# Patient Record
Sex: Female | Born: 1940 | Race: Black or African American | Hispanic: No | Smoking: Never smoker
Health system: Southern US, Community
[De-identification: ages and names within clinical notes are randomized; demographics above are authoritative.]

## PROBLEM LIST (undated history)

## (undated) HISTORY — PX: ABDOMINAL HYSTERECTOMY: SHX81

---

## 2010-01-19 ENCOUNTER — Emergency Department (HOSPITAL_COMMUNITY): Admission: EM | Admit: 2010-01-19 | Discharge: 2010-01-19 | Payer: Self-pay | Admitting: Emergency Medicine

## 2010-11-09 LAB — HEPATIC FUNCTION PANEL
ALT: 37 U/L — ABNORMAL HIGH (ref 0–35)
Bilirubin, Direct: 0.3 mg/dL (ref 0.0–0.3)
Indirect Bilirubin: 1.1 mg/dL — ABNORMAL HIGH (ref 0.3–0.9)

## 2010-11-09 LAB — BASIC METABOLIC PANEL
BUN: 18 mg/dL (ref 6–23)
CO2: 28 mEq/L (ref 19–32)
GFR calc non Af Amer: 46 mL/min — ABNORMAL LOW (ref >60)
Potassium: 3.6 mEq/L (ref 3.5–5.1)
Sodium: 134 mEq/L — ABNORMAL LOW (ref 135–145)

## 2010-11-09 LAB — DIFFERENTIAL
Lymphs Abs: 0.7 10*3/uL (ref 0.7–4.0)
Monocytes Relative: 9 % (ref 3–12)
Neutrophils Relative %: 61 % (ref 43–77)

## 2010-11-09 LAB — URINALYSIS, ROUTINE W REFLEX MICROSCOPIC
Glucose, UA: NEGATIVE mg/dL
Hgb urine dipstick: NEGATIVE
Ketones, ur: 15 mg/dL — AB
Specific Gravity, Urine: 1.025 (ref 1.005–1.030)

## 2010-11-09 LAB — URINE CULTURE: Colony Count: NO GROWTH

## 2010-11-09 LAB — URINE MICROSCOPIC-ADD ON

## 2010-11-09 LAB — CBC
MCV: 90.8 fL (ref 78.0–100.0)
Platelets: 118 10*3/uL — ABNORMAL LOW (ref 150–400)
RDW: 12 % (ref 11.5–15.5)

## 2010-11-09 LAB — POCT CARDIAC MARKERS
CKMB, poc: <1 ng/mL — ABNORMAL LOW (ref 1.0–8.0)
Troponin i, poc: <0.05 ng/mL (ref 0.00–0.09)

## 2011-09-11 IMAGING — CR DG CHEST 2V
2 series · 2 of 2 positions shown · non-contrast
Comparison: None

CLINICAL DATA: Dizziness, weakness, fever.

CHEST - 2 VIEW

[w chest pa]
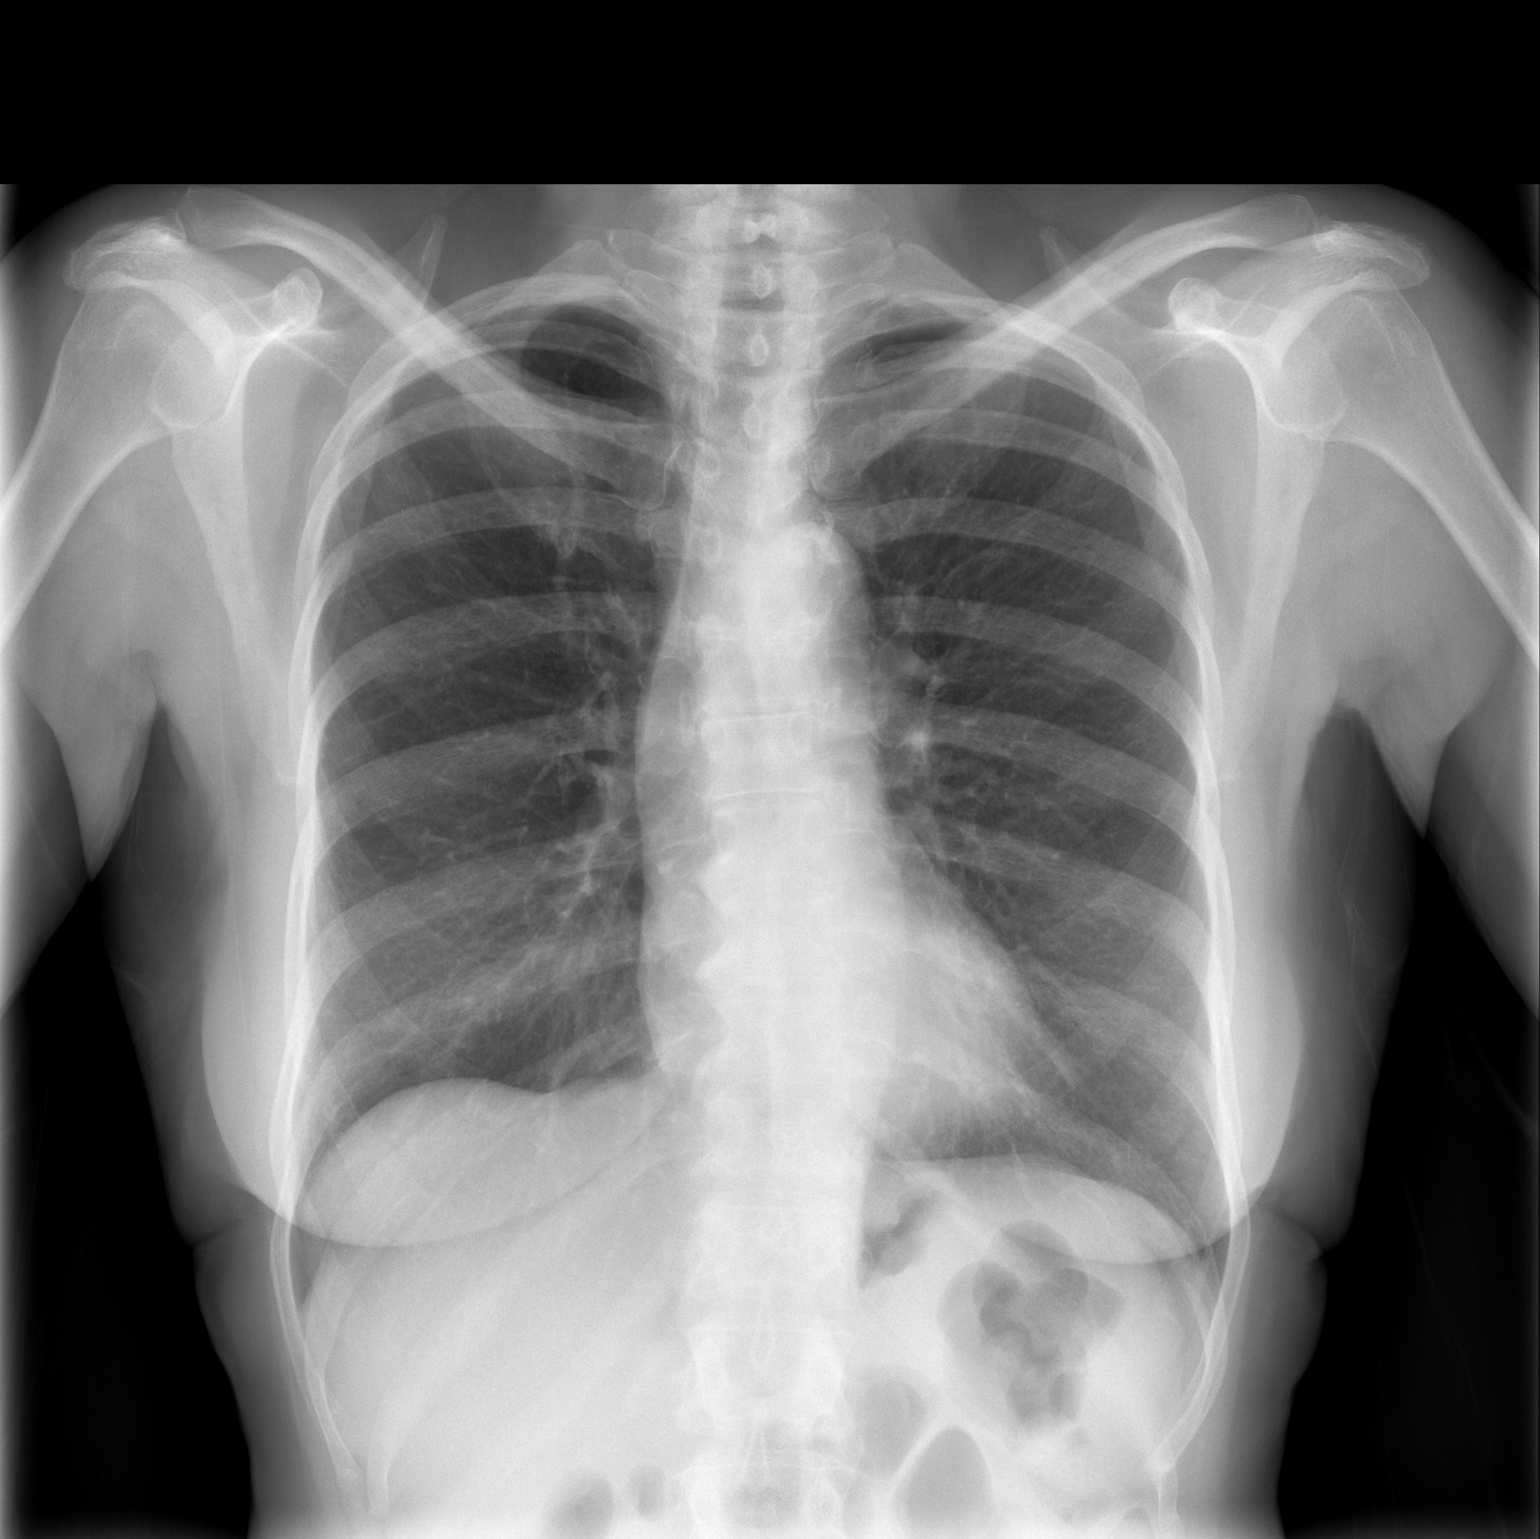

[w chest lat]
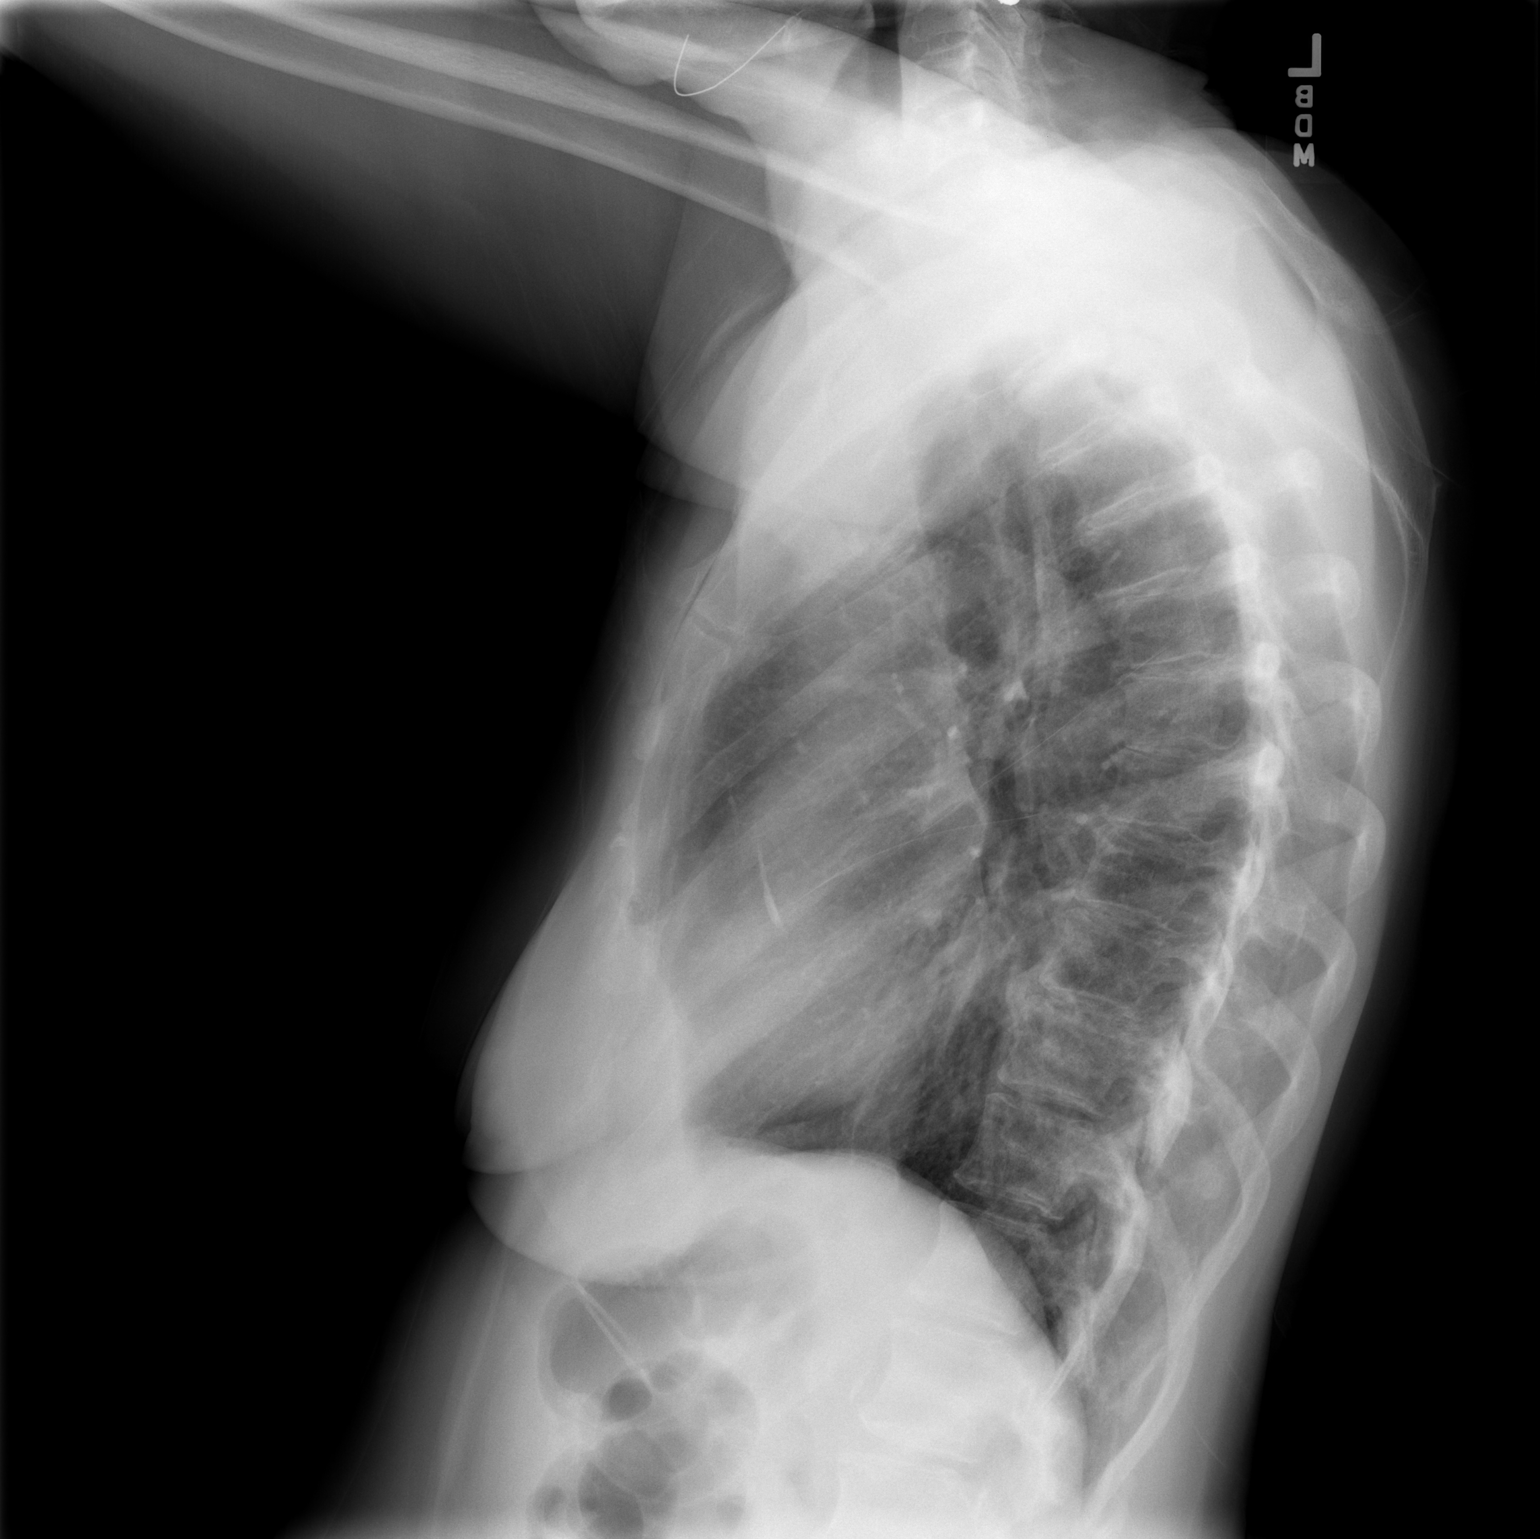

[2 of 2 positions shown; findings below may reference images not displayed]

FINDINGS: Heart and mediastinal contours are within normal limits.
No focal opacities or effusions.  No acute bony abnormality.
IMPRESSION: No acute cardiopulmonary disease.

## 2017-11-15 ENCOUNTER — Encounter (INDEPENDENT_AMBULATORY_CARE_PROVIDER_SITE_OTHER): Payer: Medicare Other | Admitting: Ophthalmology

## 2017-11-15 DIAGNOSIS — H43813 Vitreous degeneration, bilateral: Secondary | ICD-10-CM

## 2017-11-15 DIAGNOSIS — H2513 Age-related nuclear cataract, bilateral: Secondary | ICD-10-CM | POA: Diagnosis not present

## 2017-11-15 DIAGNOSIS — H3509 Other intraretinal microvascular abnormalities: Secondary | ICD-10-CM

## 2017-11-15 DIAGNOSIS — H353111 Nonexudative age-related macular degeneration, right eye, early dry stage: Secondary | ICD-10-CM | POA: Diagnosis not present

## 2017-11-16 ENCOUNTER — Encounter (INDEPENDENT_AMBULATORY_CARE_PROVIDER_SITE_OTHER): Payer: Self-pay | Admitting: Ophthalmology

## 2018-01-17 ENCOUNTER — Encounter (INDEPENDENT_AMBULATORY_CARE_PROVIDER_SITE_OTHER): Payer: Medicare Other | Admitting: Ophthalmology

## 2018-01-27 ENCOUNTER — Encounter (INDEPENDENT_AMBULATORY_CARE_PROVIDER_SITE_OTHER): Payer: Medicare Other | Admitting: Ophthalmology

## 2018-01-27 DIAGNOSIS — I10 Essential (primary) hypertension: Secondary | ICD-10-CM

## 2018-01-27 DIAGNOSIS — H35033 Hypertensive retinopathy, bilateral: Secondary | ICD-10-CM

## 2018-01-27 DIAGNOSIS — H3509 Other intraretinal microvascular abnormalities: Secondary | ICD-10-CM

## 2018-08-03 ENCOUNTER — Encounter (INDEPENDENT_AMBULATORY_CARE_PROVIDER_SITE_OTHER): Payer: Medicare Other | Admitting: Ophthalmology

## 2018-08-30 ENCOUNTER — Encounter (INDEPENDENT_AMBULATORY_CARE_PROVIDER_SITE_OTHER): Payer: Medicare Other | Admitting: Ophthalmology

## 2019-01-12 ENCOUNTER — Encounter: Payer: Self-pay | Admitting: Cardiology

## 2019-01-12 ENCOUNTER — Other Ambulatory Visit: Payer: Self-pay

## 2019-01-12 ENCOUNTER — Telehealth: Payer: Self-pay | Admitting: Cardiovascular Disease

## 2019-01-12 ENCOUNTER — Ambulatory Visit: Payer: Medicare Other | Admitting: Cardiology

## 2019-01-12 ENCOUNTER — Ambulatory Visit (HOSPITAL_COMMUNITY)
Admission: RE | Admit: 2019-01-12 | Discharge: 2019-01-12 | Disposition: A | Discharge status: Home / Self Care | Payer: Medicare Other | Source: Ambulatory Visit | Attending: Cardiology | Admitting: Cardiology

## 2019-01-12 ENCOUNTER — Other Ambulatory Visit (HOSPITAL_COMMUNITY): Payer: Self-pay | Admitting: Cardiology

## 2019-01-12 VITALS — BP 153/80 | HR 81 | Ht 64.0 in | Wt 127.2 lb

## 2019-01-12 DIAGNOSIS — Z79899 Other long term (current) drug therapy: Secondary | ICD-10-CM

## 2019-01-12 DIAGNOSIS — I739 Peripheral vascular disease, unspecified: Secondary | ICD-10-CM | POA: Insufficient documentation

## 2019-01-12 DIAGNOSIS — M79672 Pain in left foot: Secondary | ICD-10-CM | POA: Insufficient documentation

## 2019-01-12 MED ORDER — CEPHALEXIN 500 MG PO CAPS: 500.0000 mg | ORAL_CAPSULE | Freq: Three times a day (TID) | ORAL | 0 refills | Status: DC

## 2019-01-12 NOTE — Progress Notes (Signed)
Cardiology Office Note   Date:  01/12/2019   ID:  Gina MaterMargie Klayman, DOB 10-17-1940, MRN [id number]  PCP:  Patient, No Pcp Per  Cardiologist:   Rollene RotundaJames Favor Kreh, MD Referring:  Dr. Laretta BolsterAllouny  No chief complaint on file.     History of Present Illness: Gina MaterMargie Osberg is a 78 y.o. female who presents for evaluation of left foot pain.  The patient has never been seen before.  It does not sound like she has much in the way of medical care at all.  She says she does not know to go to the doctors.  She does not report any significant past medical history.  She has been seeing podiatry and apparently had the toenail trimmed she said several days ago.  She went to see the podiatrist again today.  She reports that she has had discomfort in her foot for only a few days but the podiatry notes and just that she has been self treating her left foot for several days.  She has been treated with antibiotics.  Because the foot seem to have more uncomfortable quickly she was sent for this appointment in vascular studies.  The patient is really vague about her symptoms.  She says it really has not been hurting but when I go to examine it she is flinching.  She is denying any fevers or chills.  She is never had any cardiovascular complaint such as palpitations, presyncope or syncope.  She is not having any chest pressure, neck or arm discomfort.  She has no shortness of breath, PND or orthopnea.  He says that she really did not notice her foot was red until this morning but it sounds like from the 1 office note I have that this probably has been going on for several days.  Of note I did review the notes and the patient had antibiotics prescribed amoxicillin clavulanic acid 1 tablet 3 times daily today but she was not aware that she actually had this prescribed.  PMH:  None  PSH:  Abdominal hystrectomy  Past Surgical History:  Procedure Laterality Date  . ABDOMINAL HYSTERECTOMY       Current Outpatient  Medications  Medication Sig Dispense Refill  . aspirin EC 81 MG tablet Take 1 tablet by mouth daily.    . cephALEXin (KEFLEX) 500 MG capsule Take 1 capsule (500 mg total) by mouth 3 (three) times daily. For 7 days 21 capsule 0   No current facility-administered medications for this visit.     Allergies:   Patient has no known allergies.    Social History:  The patient  reports that she has never smoked. She has never used smokeless tobacco. She reports that she does not drink alcohol.   Family History:  The patient's none.  The patient does not know her family history   ROS:  Please see the history of present illness.   Otherwise, review of systems are positive for none.   All other systems are reviewed and negative.    PHYSICAL EXAM: VS:  BP (!) 153/80   Pulse 81   Ht 5\' 4"  (1.626 m)   Wt 127 lb 3.2 oz (57.7 kg)   BMI 21.83 kg/m  , BMI Body mass index is 21.83 kg/m. GENERAL:  Well appearing HEENT:  Pupils equal round and reactive, fundi not visualized, oral mucosa unremarkable NECK:  No jugular venous distention, waveform within normal limits, carotid upstroke brisk and symmetric, no bruits, no thyromegaly LYMPHATICS:  No cervical,  inguinal adenopathy LUNGS:  Clear to auscultation bilaterally BACK:  No CVA tenderness CHEST:  Unremarkable HEART:  PMI not displaced or sustained,S1 and S2 within normal limits, no S3, no S4, no clicks, no rubs, no murmurs ABD:  Flat, positive bowel sounds normal in frequency in pitch, no bruits, no rebound, no guarding, no midline pulsatile mass, no hepatomegaly, no splenomegaly EXT:  Erythema of the left foot, absent popliteals bilaterally, absent dorsalis pedis and posterior tibialis, SKIN:  No rashes no nodules NEURO:  Cranial nerves II through XII grossly intact, motor grossly intact throughout PSYCH:  Cognitively intact, oriented to person place and time    EKG:  EKG is ordered today. The ekg ordered today demonstrates sinus rhythm, rate  69, axis within normal limits, intervals within limits, no acute changes.   Recent Labs: No results found for requested labs within last 8760 hours.    Lipid Panel No results found for: CHOL, TRIG, HDL, CHOLHDL, VLDL, LDLCALC, LDLDIRECT    Wt Readings from Last 3 Encounters:  01/12/19 127 lb 3.2 oz (57.7 kg)      Other studies Reviewed: Additional studies/ records that were reviewed today include: Podiatry records. Review of the above records demonstrates:  Please see elsewhere in the note.     ASSESSMENT AND PLAN:  PVD: The patient was sent today for arterial Dopplers and is found to have preliminarily severe disease.  ABI on the left is 0.38.  On the right is 0.85.  I do note that she was given a prescription for amoxicillin clavulanic acid but she was not aware of this.  We are making sure that this is in the pharmacy for her to pick up.  I have also reviewed the case with Dr. Allyson Sabal.  The patient needs to come back on Tuesday for CBC, basic metabolic profile.  She will see Dr. Allyson Sabal at that time.  She is instructed that should she get increasing pain or erythema or swelling over the weekend she should go to the emergency room.  There was no evidence of thrombus.    HTN: Her blood pressure is elevated.  This can be managed after gets repeated readings.  Unfortunately the patient seems very disconnected from any understanding of her clinical situation and has not had any medical care for the most part to speak of.  She will need aggressive risk reduction and management going forward.   Current medicines are reviewed at length with the patient today.  The patient does not have concerns regarding medicines.  The following changes have been made:  As above  Labs/ tests ordered today include:  No orders of the defined types were placed in this encounter.    Disposition:   FU with blood work on Tuesday and Dr. Allyson Sabal will see her briefly to discuss angiography.     Signed, Rollene Rotunda, MD  01/12/2019 5:11 PM    [facility]

## 2019-01-12 NOTE — Telephone Encounter (Signed)
Pt have office visit with Dr Antoine Poche today

## 2019-01-12 NOTE — Patient Instructions (Addendum)
Medication Instructions:  Continue current medications  If you need a refill on your cardiac medications before your next appointment, please call your pharmacy.  Labwork: CBC and BMP  Testing/Procedures: None Ordered  Follow-Up: Your physician recommends that you schedule a follow-up appointment in: with Dr Allyson Sabal Tuesday  At [facility], you and your health needs are our priority.  As part of our continuing mission to provide you with exceptional heart care, we have created designated Provider Care Teams.  These Care Teams include your primary Cardiologist (physician) and Advanced Practice Providers (APPs -  Physician Assistants and Nurse Practitioners) who all work together to provide you with the care you need, when you need it.  Thank you for choosing [facility] at [facility]!!

## 2019-01-16 ENCOUNTER — Other Ambulatory Visit: Payer: Self-pay

## 2019-01-16 ENCOUNTER — Telehealth: Payer: Self-pay | Admitting: *Deleted

## 2019-01-16 ENCOUNTER — Ambulatory Visit: Payer: Medicare Other | Admitting: Cardiovascular Disease

## 2019-01-16 ENCOUNTER — Encounter: Payer: Self-pay | Admitting: Cardiovascular Disease

## 2019-01-16 ENCOUNTER — Telehealth: Payer: Self-pay

## 2019-01-16 DIAGNOSIS — I70229 Atherosclerosis of native arteries of extremities with rest pain, unspecified extremity: Secondary | ICD-10-CM | POA: Insufficient documentation

## 2019-01-16 DIAGNOSIS — Z959 Presence of cardiac and vascular implant and graft, unspecified: Secondary | ICD-10-CM | POA: Diagnosis not present

## 2019-01-16 DIAGNOSIS — I998 Other disorder of circulatory system: Secondary | ICD-10-CM | POA: Diagnosis not present

## 2019-01-16 NOTE — Assessment & Plan Note (Signed)
Gina Carter was referred to me by Dr. Antoine Poche who saw this patient last Friday for critical limb ischemia.  She is 78 years old without significant risk factors.  She developed left foot pain approximately 3 to 6 weeks ago.  He saw her podiatrist who referred her here.  She had Doppler studies performed 01/14/2019 revealing high-grade mid left SFA with occluded tibials on that side.  Her ABI on the right was 25 and on the left was 0.38.  She will need angiography and potential endovascular therapy for rest pain.  I did speak to her daughter [name] and explained the situation.

## 2019-01-16 NOTE — Progress Notes (Signed)
01/16/2019 [name]   Dec 06, 1940  [id number]  Primary Physician Patient, No Pcp Per Primary Cardiologist: Runell Gess MD Milagros Loll, [place], [place]  HPI:  Norita Davault is a 78 y.o. thin appearing widowed African-American female mother of 3, grandmother of 6 grandchildren referred to me by Dr. Antoine Poche for peripheral vascular valuation because of critical limb ischemia and rest pain.  She has no cardiac risk factors.  She was referred by Dr. Apolonio Schneiders to Dr. Antoine Poche because of this.  She has had dependent rubor and pain in her left foot for several weeks.  There are no open wounds.  She cannot walk with this.  Dopplers performed 01/14/2019 revealed high-grade mid left SFA stenosis with 0 vessel runoff.  Her right ABI was 0.85 and left was 0.38.   Current Meds  Medication Sig  . aspirin EC 81 MG tablet Take 1 tablet by mouth daily.     No Known Allergies  Social History   Socioeconomic History  . Marital status: Widowed    Spouse name: Not on file  . Number of children: Not on file  . Years of education: Not on file  . Highest education level: Not on file  Occupational History  . Not on file  Social Needs  . Financial resource strain: Not on file  . Food insecurity:    Worry: Not on file    Inability: Not on file  . Transportation needs:    Medical: Not on file    Non-medical: Not on file  Tobacco Use  . Smoking status: Never Smoker  . Smokeless tobacco: Never Used  Substance and Sexual Activity  . Alcohol use: Never    Frequency: Never  . Drug use: Not on file  . Sexual activity: Not on file  Lifestyle  . Physical activity:    Days per week: Not on file    Minutes per session: Not on file  . Stress: Not on file  Relationships  . Social connections:    Talks on phone: Not on file    Gets together: Not on file    Attends religious service: Not on file    Active member of club or organization: Not on file    Attends meetings of clubs or organizations:  Not on file    Relationship status: Not on file  . Intimate partner violence:    Fear of current or ex partner: Not on file    Emotionally abused: Not on file    Physically abused: Not on file    Forced sexual activity: Not on file  Other Topics Concern  . Not on file  Social History Narrative   Lives alone.  Three children.       Review of Systems: General: negative for chills, fever, night sweats or weight changes.  Cardiovascular: negative for chest pain, dyspnea on exertion, edema, orthopnea, palpitations, paroxysmal nocturnal dyspnea or shortness of breath Dermatological: negative for rash Respiratory: negative for cough or wheezing Urologic: negative for hematuria Abdominal: negative for nausea, vomiting, diarrhea, bright red blood per rectum, melena, or hematemesis Neurologic: negative for visual changes, syncope, or dizziness All other systems reviewed and are otherwise negative except as noted above.    Blood pressure 118/78, pulse 79, temperature 98.4 F (36.9 C), height 5\' 7"  (1.702 m), weight 127 lb (57.6 kg).  General appearance: alert and no distress Neck: no adenopathy, no carotid bruit, no JVD, supple, symmetrical, trachea midline and thyroid not enlarged, symmetric, no  tenderness/mass/nodules Lungs: clear to auscultation bilaterally Heart: regular rate and rhythm, S1, S2 normal, no murmur, click, rub or gallop Extremities: extremities normal, atraumatic, no cyanosis or edema Pulses: Absent left pedal pulses Skin: Dependent rubor on the left Neurologic: Alert and oriented X 3, normal strength and tone. Normal symmetric reflexes. Normal coordination and gait  EKG normal sinus rhythm at 79 with nonspecific ST and T wave changes.  I personally reviewed this EKG.  ASSESSMENT AND PLAN:   Critical limb ischemia with history of revascularization of same extremity Ms. Raul Dellston was referred to me by Dr. Antoine PocheHochrein who saw this patient last Friday for critical limb  ischemia.  She is 78 years old without significant risk factors.  She developed left foot pain approximately 3 to 6 weeks ago.  He saw her podiatrist who referred her here.  She had Doppler studies performed 01/14/2019 revealing high-grade mid left SFA with occluded tibials on that side.  Her ABI on the right was 25 and on the left was 0.38.  She will need angiography and potential endovascular therapy for rest pain.  I did speak to her daughter Clydie BraunKaren and explained the situation.      Runell GessJonathan J.  MD FACP,FACC,FAHA, FSCAI 01/16/2019 2:10 PM

## 2019-01-16 NOTE — Telephone Encounter (Signed)

## 2019-01-16 NOTE — Telephone Encounter (Signed)
Received verbal consent from pt. Spoke with patient daughter [name] regarding setting up pt for PV procedure on 5/28.  She was made aware that pt to arrive at [facility] at 0900 for COVID testing on day of procedure and then report to [place] 2 hours prior to 1200 procedure. Patient instructions reviewed with pt and pt cousin in detail during OV on 5/26. Will contact pt daughter to review info on 5/27

## 2019-01-16 NOTE — H&P (View-Only) (Signed)
01/16/2019 [name]   Dec 06, 1940  [id number]  Primary Physician Patient, No Pcp Per Primary Cardiologist: Runell Gess MD Gina Carter, Gina Carter, Gina Carter  HPI:  Gina Carter is a 78 y.o. thin appearing widowed African-American female mother of 3, grandmother of 6 grandchildren referred to me by Dr. Antoine Poche for peripheral vascular valuation because of critical limb ischemia and rest pain.  She has no cardiac risk factors.  She was referred by Dr. Apolonio Schneiders to Dr. Antoine Poche because of this.  She has had dependent rubor and pain in her left foot for several weeks.  There are no open wounds.  She cannot walk with this.  Dopplers performed 01/14/2019 revealed high-grade mid left SFA stenosis with 0 vessel runoff.  Her right ABI was 0.85 and left was 0.38.   Current Meds  Medication Sig  . aspirin EC 81 MG tablet Take 1 tablet by mouth daily.     No Known Allergies  Social History   Socioeconomic History  . Marital status: Widowed    Spouse name: Not on file  . Number of children: Not on file  . Years of education: Not on file  . Highest education level: Not on file  Occupational History  . Not on file  Social Needs  . Financial resource strain: Not on file  . Food insecurity:    Worry: Not on file    Inability: Not on file  . Transportation needs:    Medical: Not on file    Non-medical: Not on file  Tobacco Use  . Smoking status: Never Smoker  . Smokeless tobacco: Never Used  Substance and Sexual Activity  . Alcohol use: Never    Frequency: Never  . Drug use: Not on file  . Sexual activity: Not on file  Lifestyle  . Physical activity:    Days per week: Not on file    Minutes per session: Not on file  . Stress: Not on file  Relationships  . Social connections:    Talks on phone: Not on file    Gets together: Not on file    Attends religious service: Not on file    Active member of club or organization: Not on file    Attends meetings of clubs or organizations:  Not on file    Relationship status: Not on file  . Intimate partner violence:    Fear of current or ex partner: Not on file    Emotionally abused: Not on file    Physically abused: Not on file    Forced sexual activity: Not on file  Other Topics Concern  . Not on file  Social History Narrative   Lives alone.  Three children.       Review of Systems: General: negative for chills, fever, night sweats or weight changes.  Cardiovascular: negative for chest pain, dyspnea on exertion, edema, orthopnea, palpitations, paroxysmal nocturnal dyspnea or shortness of breath Dermatological: negative for rash Respiratory: negative for cough or wheezing Urologic: negative for hematuria Abdominal: negative for nausea, vomiting, diarrhea, bright red blood per rectum, melena, or hematemesis Neurologic: negative for visual changes, syncope, or dizziness All other systems reviewed and are otherwise negative except as noted above.    Blood pressure 118/78, pulse 79, temperature 98.4 F (36.9 C), height 5\' 7"  (1.702 m), weight 127 lb (57.6 kg).  General appearance: alert and no distress Neck: no adenopathy, no carotid bruit, no JVD, supple, symmetrical, trachea midline and thyroid not enlarged, symmetric, no  tenderness/mass/nodules Lungs: clear to auscultation bilaterally Heart: regular rate and rhythm, S1, S2 normal, no murmur, click, rub or gallop Extremities: extremities normal, atraumatic, no cyanosis or edema Pulses: Absent left pedal pulses Skin: Dependent rubor on the left Neurologic: Alert and oriented X 3, normal strength and tone. Normal symmetric reflexes. Normal coordination and gait  EKG normal sinus rhythm at 79 with nonspecific ST and T wave changes.  I personally reviewed this EKG.  ASSESSMENT AND PLAN:   Critical limb ischemia with history of revascularization of same extremity Ms. Rady was referred to me by Dr. Hochrein who saw this patient last Friday for critical limb  ischemia.  She is 77 years old without significant risk factors.  She developed left foot pain approximately 3 to 6 weeks ago.  He saw her podiatrist who referred her here.  She had Doppler studies performed 01/14/2019 revealing high-grade mid left SFA with occluded tibials on that side.  Her ABI on the right was 25 and on the left was 0.38.  She will need angiography and potential endovascular therapy for rest pain.  I did speak to her daughter Gina Carter and explained the situation.      Wanza Szumski J. Nil Xiong MD FACP,FACC,FAHA, FSCAI 01/16/2019 2:10 PM 

## 2019-01-16 NOTE — Telephone Encounter (Signed)
ERROR

## 2019-01-16 NOTE — Patient Instructions (Addendum)
    [facility] CARDIOVASCULAR DIVISION [facility] [address] [place] [address] Dept: [phone] Loc: [phone]  Letti Ballog  01/16/2019  You are scheduled for a Peripheral Angiogram on Thursday, May 28 with Dr. [name].  1. Please arrive at 9:00AM at [facility]  [address] [place], [place] [address] FOR YOUR COVID-19 TEST. YOU WILL ALSO NEED TO QUARANTINE YOURSELF AFTER THE COVID-19 TEST UNTIL YOUR RESULTS COME BACK AS NEGATIVE.    Please arrive at the [facility] (Main Entrance A) at [facility]: [address] [place], [place] [address] at 10:00 AM FOR YOUR PROCEDURE (This time is two hours before your procedure to ensure your preparation). Free valet parking service is available.   Special note: Every effort is made to have your procedure done on time. Please understand that emergencies sometimes delay scheduled procedures.  2. Diet: Do not eat solid foods after midnight.  The patient may have clear liquids until 5am upon the day of the procedure.  3. Labs: You will need to have blood drawn TODAY: CBC AND BMP   4. Medication instructions in preparation for your procedure:   On the morning of your procedure, take your Aspirin and any morning medicines NOT listed above.  You may use sips of water.  5. Plan for one night stay--bring personal belongings. 6. Bring a current list of your medications and current insurance cards. 7. You MUST have a responsible person to drive you home. 8. Someone MUST be with you the first 24 hours after you arrive home or your discharge will be delayed. 9. Please wear clothes that are easy to get on and off and wear slip-on shoes.  Thank you for allowing [place] to care for you!   --  Invasive Cardiovascular services  Testing/Procedures: Your physician has requested that you have a lower or upper extremity  arterial duplex. This test is an ultrasound of the arteries in the legs or arms. It looks at arterial blood flow in the legs and arms. Allow one hour for Lower and Upper Arterial scans. There are no restrictions or special instructions TO BE SCHEDULED FOR 1 WEEK AFTER YOUR PROCEDURE  Your physician has requested that you have an ankle brachial index (ABI). During this test an ultrasound and blood pressure cuff are used to evaluate the arteries that supply the arms and legs with blood. Allow thirty minutes for this exam. There are no restrictions or special instructions. TO BE SCHEDULED FOR 1 WEEK AFTER YOUR PROCEDURE   Follow-Up: At [facility], you and your health needs are our priority.  As part of our continuing mission to provide you with exceptional heart care, we have created designated Provider Care Teams.  These Care Teams include your primary Cardiologist (physician) and Advanced Practice Providers (APPs -  Physician Assistants and Nurse Practitioners) who all work together to provide you with the care you need, when you need it. You will need a follow up appointment in 2-3 weeks WITH DR. Allyson Sabal.

## 2019-01-17 ENCOUNTER — Other Ambulatory Visit: Payer: Self-pay

## 2019-01-17 ENCOUNTER — Telehealth: Payer: Self-pay

## 2019-01-17 DIAGNOSIS — I998 Other disorder of circulatory system: Secondary | ICD-10-CM

## 2019-01-17 DIAGNOSIS — Z9889 Other specified postprocedural states: Secondary | ICD-10-CM

## 2019-01-17 LAB — BASIC METABOLIC PANEL
BUN/Creatinine Ratio: 11 — ABNORMAL LOW (ref 12–28)
BUN: 8 mg/dL (ref 8–27)
CO2: 25 mmol/L (ref 20–29)
Calcium: 10.4 mg/dL — ABNORMAL HIGH (ref 8.7–10.3)
Chloride: 101 mmol/L (ref 96–106)
Creatinine, Ser: 0.7 mg/dL (ref 0.57–1.00)
GFR calc Af Amer: 96 mL/min/{1.73_m2} (ref >59)
GFR calc non Af Amer: 83 mL/min/{1.73_m2} (ref >59)
Glucose: 118 mg/dL — ABNORMAL HIGH (ref 65–99)
Potassium: 4.8 mmol/L (ref 3.5–5.2)
Sodium: 142 mmol/L (ref 134–144)

## 2019-01-17 LAB — CBC
Hematocrit: 38 % (ref 34.0–46.6)
Hemoglobin: 12.4 g/dL (ref 11.1–15.9)
MCH: 30.2 pg (ref 26.6–33.0)
MCHC: 32.6 g/dL (ref 31.5–35.7)
MCV: 93 fL (ref 79–97)
Platelets: 462 10*3/uL — ABNORMAL HIGH (ref 150–450)
RBC: 4.11 x10E6/uL (ref 3.77–5.28)
RDW: 12 % (ref 11.7–15.4)
WBC: 9.8 10*3/uL (ref 3.4–10.8)

## 2019-01-17 NOTE — Telephone Encounter (Signed)
Spoke with pt daughter [name] and reviewed pt AVS and answered any questions she had regarding pt procedure tomorrow. She verbalized understanding of info

## 2019-01-18 ENCOUNTER — Other Ambulatory Visit (HOSPITAL_COMMUNITY)
Admission: RE | Admit: 2019-01-18 | Discharge: 2019-01-18 | Disposition: A | Discharge status: Home / Self Care | Payer: Medicare Other | Source: Ambulatory Visit | Attending: Cardiovascular Disease | Admitting: Cardiovascular Disease

## 2019-01-18 ENCOUNTER — Other Ambulatory Visit: Payer: Self-pay

## 2019-01-18 ENCOUNTER — Ambulatory Visit (HOSPITAL_COMMUNITY)
Admission: RE | Admit: 2019-01-18 | Discharge: 2019-01-19 | Disposition: A | Discharge status: Home / Self Care | Payer: Medicare Other | Attending: Cardiovascular Disease | Admitting: Cardiovascular Disease

## 2019-01-18 ENCOUNTER — Encounter (HOSPITAL_COMMUNITY)
Admission: RE | Disposition: A | Discharge status: Home / Self Care | Payer: Medicare Other | Source: Home / Self Care | Attending: Cardiovascular Disease

## 2019-01-18 DIAGNOSIS — I70212 Atherosclerosis of native arteries of extremities with intermittent claudication, left leg: Secondary | ICD-10-CM | POA: Diagnosis not present

## 2019-01-18 DIAGNOSIS — Z955 Presence of coronary angioplasty implant and graft: Secondary | ICD-10-CM | POA: Diagnosis not present

## 2019-01-18 DIAGNOSIS — Z7902 Long term (current) use of antithrombotics/antiplatelets: Secondary | ICD-10-CM | POA: Diagnosis not present

## 2019-01-18 DIAGNOSIS — Z1159 Encounter for screening for other viral diseases: Secondary | ICD-10-CM | POA: Insufficient documentation

## 2019-01-18 DIAGNOSIS — I70229 Atherosclerosis of native arteries of extremities with rest pain, unspecified extremity: Secondary | ICD-10-CM | POA: Diagnosis present

## 2019-01-18 DIAGNOSIS — I998 Other disorder of circulatory system: Secondary | ICD-10-CM | POA: Diagnosis not present

## 2019-01-18 DIAGNOSIS — M79672 Pain in left foot: Secondary | ICD-10-CM | POA: Diagnosis not present

## 2019-01-18 DIAGNOSIS — Z79899 Other long term (current) drug therapy: Secondary | ICD-10-CM | POA: Diagnosis not present

## 2019-01-18 DIAGNOSIS — Z7982 Long term (current) use of aspirin: Secondary | ICD-10-CM | POA: Diagnosis not present

## 2019-01-18 HISTORY — PX: ABDOMINAL AORTOGRAM W/LOWER EXTREMITY: CATH118223

## 2019-01-18 HISTORY — PX: PERIPHERAL VASCULAR INTERVENTION: CATH118257

## 2019-01-18 HISTORY — PX: PERIPHERAL VASCULAR BALLOON ANGIOPLASTY: CATH118281

## 2019-01-18 LAB — SARS CORONAVIRUS 2 BY RT PCR (HOSPITAL ORDER, PERFORMED IN ~~LOC~~ HOSPITAL LAB): SARS Coronavirus 2: NEGATIVE

## 2019-01-18 LAB — POCT ACTIVATED CLOTTING TIME
Activated Clotting Time: 268 seconds
Activated Clotting Time: 312 seconds
Activated Clotting Time: 230 seconds
Activated Clotting Time: 202 seconds
Activated Clotting Time: 175 seconds

## 2019-01-18 SURGERY — ABDOMINAL AORTOGRAM W/LOWER EXTREMITY
Anesthesia: LOCAL | Laterality: Left

## 2019-01-18 MED ORDER — LIDOCAINE HCL (PF) 1 % IJ SOLN
INTRAMUSCULAR | Status: AC
  Filled 2019-01-18: qty 30

## 2019-01-18 MED ORDER — ATORVASTATIN CALCIUM 80 MG PO TABS: 80.0000 mg | ORAL_TABLET | Freq: Every day | ORAL | Status: DC

## 2019-01-18 MED ORDER — ACETAMINOPHEN 325 MG PO TABS
ORAL_TABLET | ORAL | Status: AC
  Filled 2019-01-18: qty 2

## 2019-01-18 MED ORDER — MIDAZOLAM HCL 2 MG/2ML IJ SOLN
INTRAMUSCULAR | Status: DC | PRN
  Administered 2019-01-18: 1 mg via INTRAVENOUS

## 2019-01-18 MED ORDER — SODIUM CHLORIDE 0.9% FLUSH: 3.0000 mL | INTRAVENOUS | Status: DC | PRN

## 2019-01-18 MED ORDER — HEPARIN (PORCINE) IN NACL 1000-0.9 UT/500ML-% IV SOLN
INTRAVENOUS | Status: DC | PRN
  Administered 2019-01-18 (×2): 500 mL

## 2019-01-18 MED ORDER — IODIXANOL 320 MG/ML IV SOLN
INTRAVENOUS | Status: DC | PRN
  Administered 2019-01-18: 14:00:00 180 mL via INTRA_ARTERIAL

## 2019-01-18 MED ORDER — SODIUM CHLORIDE 0.9 % WEIGHT BASED INFUSION: 1.0000 mL/kg/h | INTRAVENOUS | Status: DC

## 2019-01-18 MED ORDER — FENTANYL CITRATE (PF) 100 MCG/2ML IJ SOLN
INTRAMUSCULAR | Status: AC
  Filled 2019-01-18: qty 2

## 2019-01-18 MED ORDER — ASPIRIN EC 81 MG PO TBEC: 81.0000 mg | DELAYED_RELEASE_TABLET | Freq: Every day | ORAL | Status: DC

## 2019-01-18 MED ORDER — AMOXICILLIN-POT CLAVULANATE 500-125 MG PO TABS
1.0000 | ORAL_TABLET | Freq: Two times a day (BID) | ORAL | Status: DC
  Administered 2019-01-18 – 2019-01-19 (×2): 500 mg via ORAL
  Filled 2019-01-18 (×3): qty 1

## 2019-01-18 MED ORDER — SODIUM CHLORIDE 0.9 % IV SOLN: 250.0000 mL | INTRAVENOUS | Status: DC | PRN

## 2019-01-18 MED ORDER — SODIUM CHLORIDE 0.9% FLUSH
3.0000 mL | Freq: Two times a day (BID) | INTRAVENOUS | Status: DC
  Administered 2019-01-18: 3 mL via INTRAVENOUS

## 2019-01-18 MED ORDER — HYDRALAZINE HCL 20 MG/ML IJ SOLN: 5.0000 mg | INTRAMUSCULAR | Status: DC | PRN

## 2019-01-18 MED ORDER — SODIUM CHLORIDE 0.9 % IV SOLN: INTRAVENOUS | Status: AC

## 2019-01-18 MED ORDER — ASPIRIN EC 81 MG PO TBEC
81.0000 mg | DELAYED_RELEASE_TABLET | Freq: Every day | ORAL | Status: DC
  Administered 2019-01-19: 81 mg via ORAL
  Filled 2019-01-18: qty 1

## 2019-01-18 MED ORDER — HEPARIN SODIUM (PORCINE) 1000 UNIT/ML IJ SOLN
INTRAMUSCULAR | Status: DC | PRN
  Administered 2019-01-18: 6000 [IU] via INTRAVENOUS
  Administered 2019-01-18: 2000 [IU] via INTRAVENOUS

## 2019-01-18 MED ORDER — MORPHINE SULFATE (PF) 10 MG/ML IV SOLN: 2.0000 mg | INTRAVENOUS | Status: DC | PRN

## 2019-01-18 MED ORDER — ASPIRIN 81 MG PO CHEW: 81.0000 mg | CHEWABLE_TABLET | ORAL | Status: DC

## 2019-01-18 MED ORDER — FENTANYL CITRATE (PF) 100 MCG/2ML IJ SOLN
INTRAMUSCULAR | Status: DC | PRN
  Administered 2019-01-18: 25 ug via INTRAVENOUS

## 2019-01-18 MED ORDER — HYDRALAZINE HCL 20 MG/ML IJ SOLN
INTRAMUSCULAR | Status: AC
  Filled 2019-01-18: qty 1

## 2019-01-18 MED ORDER — CLOPIDOGREL BISULFATE 75 MG PO TABS
ORAL_TABLET | ORAL | Status: AC
  Filled 2019-01-18: qty 4

## 2019-01-18 MED ORDER — CLOPIDOGREL BISULFATE 75 MG PO TABS
75.0000 mg | ORAL_TABLET | Freq: Every day | ORAL | Status: DC
  Administered 2019-01-19: 75 mg via ORAL
  Filled 2019-01-18: qty 1

## 2019-01-18 MED ORDER — CLOPIDOGREL BISULFATE 300 MG PO TABS
ORAL_TABLET | ORAL | Status: DC | PRN
  Administered 2019-01-18: 300 mg via ORAL

## 2019-01-18 MED ORDER — LIDOCAINE HCL (PF) 1 % IJ SOLN
INTRAMUSCULAR | Status: DC | PRN
  Administered 2019-01-18: 28 mL

## 2019-01-18 MED ORDER — ACETAMINOPHEN 325 MG PO TABS
650.0000 mg | ORAL_TABLET | ORAL | Status: DC | PRN
  Administered 2019-01-18: 17:00:00 650 mg via ORAL
  Filled 2019-01-18: qty 2

## 2019-01-18 MED ORDER — HYDRALAZINE HCL 20 MG/ML IJ SOLN
INTRAMUSCULAR | Status: DC | PRN
  Administered 2019-01-18: 10 mg via INTRAVENOUS

## 2019-01-18 MED ORDER — HEPARIN (PORCINE) IN NACL 1000-0.9 UT/500ML-% IV SOLN
INTRAVENOUS | Status: AC
  Filled 2019-01-18: qty 1000

## 2019-01-18 MED ORDER — ONDANSETRON HCL 4 MG/2ML IJ SOLN: 4.0000 mg | Freq: Four times a day (QID) | INTRAMUSCULAR | Status: DC | PRN

## 2019-01-18 MED ORDER — MIDAZOLAM HCL 2 MG/2ML IJ SOLN
INTRAMUSCULAR | Status: AC
  Filled 2019-01-18: qty 2

## 2019-01-18 MED ORDER — SODIUM CHLORIDE 0.9% FLUSH: 3.0000 mL | Freq: Two times a day (BID) | INTRAVENOUS | Status: DC

## 2019-01-18 MED ORDER — SODIUM CHLORIDE 0.9 % WEIGHT BASED INFUSION
3.0000 mL/kg/h | INTRAVENOUS | Status: DC
  Administered 2019-01-18: 3 mL/kg/h via INTRAVENOUS

## 2019-01-18 MED ORDER — LABETALOL HCL 5 MG/ML IV SOLN: 10.0000 mg | INTRAVENOUS | Status: DC | PRN

## 2019-01-18 SURGICAL SUPPLY — 32 items
BALLN CHOCOLATE 4.0X80X135 (BALLOONS) ×3
BALLN COYOTE OTW 4X60X150 (BALLOONS) ×3
BALLN IN.PACT DCB 5X120 (BALLOONS) ×3
BALLN STERLING OTW 5X60X135 (BALLOONS) ×3
BALLOON CHOCOLATE 4.0X80X135 (BALLOONS) ×2 IMPLANT
BALLOON COYOTE OTW 4X60X150 (BALLOONS) ×2 IMPLANT
BALLOON STERLING OTW 5X60X135 (BALLOONS) ×2 IMPLANT
CATH ANGIO 5F PIGTAIL 65CM (CATHETERS) ×3 IMPLANT
CATH CROSS OVER TEMPO 5F (CATHETERS) ×3 IMPLANT
CATH VIANCE CROSS STAND 150CM (MICROCATHETER) ×3
CATH VIANCE CROSS STD 150CM (MICROCATHETER) ×2 IMPLANT
DCB IN.PACT 5X120 (BALLOONS) ×2 IMPLANT
DEVICE CONTINUOUS FLUSH (MISCELLANEOUS) ×3 IMPLANT
DEVICE TORQUE .014-.018 (MISCELLANEOUS) ×2 IMPLANT
KIT ENCORE 26 ADVANTAGE (KITS) ×3 IMPLANT
KIT PV (KITS) ×3 IMPLANT
SHEATH HIGHFLEX ANSEL 7FR 55CM (SHEATH) ×3 IMPLANT
SHEATH PINNACLE 5F 10CM (SHEATH) ×3 IMPLANT
SHEATH PINNACLE 7F 10CM (SHEATH) ×3 IMPLANT
SHEATH PINNACLE 8F 10CM (SHEATH) ×3 IMPLANT
SHEATH PROBE COVER 6X72 (BAG) ×3 IMPLANT
STENT ELUVIA 6X80X130 (Permanent Stent) ×3 IMPLANT
STOPCOCK MORSE 400PSI 3WAY (MISCELLANEOUS) ×3 IMPLANT
SYR MEDRAD MARK 7 150ML (SYRINGE) ×3 IMPLANT
TAPE VIPERTRACK RADIOPAQ (MISCELLANEOUS) ×4 IMPLANT
TAPE VIPERTRACK RADIOPAQUE (MISCELLANEOUS) ×2
TORQUE DEVICE .014-.018 (MISCELLANEOUS) ×3
TRANSDUCER W/STOPCOCK (MISCELLANEOUS) ×3 IMPLANT
TRAY PV CATH (CUSTOM PROCEDURE TRAY) ×3 IMPLANT
TUBING CIL FLEX 10 FLL-RA (TUBING) ×3 IMPLANT
WIRE HITORQ VERSACORE ST 145CM (WIRE) ×3 IMPLANT
WIRE SPARTACORE .014X300CM (WIRE) ×3 IMPLANT

## 2019-01-18 NOTE — Progress Notes (Signed)
Site area Right groin a 8 french arterial sheath was removed  Site Prior to Removal:  Level 1  Pressure Applied For 30 MINUTES    Bedrest Beginning at 1830p  Manual:   Yes.    Patient Status During Pull:  stable  Post Pull Groin Site:  Level 1- bruise  Post Pull Instructions Given:  Yes.    Post Pull Pulses Present:  Yes.    Dressing Applied:  Yes.  Pressure dressing  Comments:

## 2019-01-18 NOTE — Telephone Encounter (Signed)
Follow Up:    Daughter wanted you to know, ptt took a Tylenol and antibiotic 5:00 this morning.  She was not supposed to take any medicine.She is supposed to be having a procedure today at 12:00

## 2019-01-18 NOTE — Addendum Note (Signed)
Addended by: Raelyn Number on: 01/18/2019 10:37 AM   Modules accepted: Orders

## 2019-01-18 NOTE — Interval H&P Note (Signed)
History and Physical Interval Note:  01/18/2019 12:28 PM  Gina Carter  has presented today for surgery, with the diagnosis of critical limb ischemia.  The various methods of treatment have been discussed with the patient and family. After consideration of risks, benefits and other options for treatment, the patient has consented to  Procedure(s): ABDOMINAL AORTOGRAM W/LOWER EXTREMITY (Bilateral) as a surgical intervention.  The patient's history has been reviewed, patient examined, no change in status, stable for surgery.  I have reviewed the patient's chart and labs.  Questions were answered to the patient's satisfaction.     [name]

## 2019-01-19 ENCOUNTER — Other Ambulatory Visit: Payer: Self-pay

## 2019-01-19 ENCOUNTER — Encounter (HOSPITAL_COMMUNITY): Payer: Self-pay | Admitting: *Deleted

## 2019-01-19 DIAGNOSIS — I998 Other disorder of circulatory system: Secondary | ICD-10-CM | POA: Diagnosis not present

## 2019-01-19 LAB — CBC
HCT: 30.7 % — ABNORMAL LOW (ref 36.0–46.0)
Hemoglobin: 10.1 g/dL — ABNORMAL LOW (ref 12.0–15.0)
MCH: 30.8 pg (ref 26.0–34.0)
MCHC: 32.9 g/dL (ref 30.0–36.0)
MCV: 93.6 fL (ref 80.0–100.0)
Platelets: 374 10*3/uL (ref 150–400)
RBC: 3.28 MIL/uL — ABNORMAL LOW (ref 3.87–5.11)
RDW: 11.8 % (ref 11.5–15.5)
WBC: 9.6 10*3/uL (ref 4.0–10.5)
nRBC: 0 % (ref 0.0–0.2)

## 2019-01-19 LAB — BASIC METABOLIC PANEL
Anion gap: 9 (ref 5–15)
BUN: 11 mg/dL (ref 8–23)
CO2: 23 mmol/L (ref 22–32)
Calcium: 8.9 mg/dL (ref 8.9–10.3)
Chloride: 106 mmol/L (ref 98–111)
Creatinine, Ser: 0.75 mg/dL (ref 0.44–1.00)
GFR calc Af Amer: >60 mL/min (ref >60)
GFR calc non Af Amer: >60 mL/min (ref >60)
Glucose, Bld: 129 mg/dL — ABNORMAL HIGH (ref 70–99)
Potassium: 4 mmol/L (ref 3.5–5.1)
Sodium: 138 mmol/L (ref 135–145)

## 2019-01-19 MED ORDER — ATORVASTATIN CALCIUM 80 MG PO TABS: 80.0000 mg | ORAL_TABLET | Freq: Every day | ORAL | 2 refills | Status: DC

## 2019-01-19 MED ORDER — CLOPIDOGREL BISULFATE 75 MG PO TABS: 75.0000 mg | ORAL_TABLET | Freq: Every day | ORAL | 2 refills | Status: DC

## 2019-01-19 MED FILL — ATORVASTATIN CALCIUM 80 MG: 80 | 90 days supply | Qty: 90 | Fill #0

## 2019-01-19 MED FILL — CLOPIDOGREL 75 MG TABLET: 75 | 90 days supply | Qty: 90 | Fill #0

## 2019-01-19 NOTE — Discharge Summary (Addendum)
Discharge Summary    Patient ID: Gina Carter,  MRN: [id number], DOB/AGE: December 04, 1940 78 y.o.  Admit date: 01/18/2019 Discharge date: 01/19/2019  Primary Care Provider: Patient, No Pcp Per Primary Cardiologist: Gina Carter. Gina Carter (PV)  Discharge Diagnoses    Active Problems:   Critical lower limb ischemia  Allergies No Known Allergies  Diagnostic Studies/Procedures    PV angiogram: 01/18/19  Angiographic Data:   1: Abdominal aorta- distal dome aorta was free of significant atherosclerotic changes 2: Left lower extremity- 70 to 90% segmental mid left SFA, occluded left popliteal artery P1 and P2 segments, occluded AT, PT and peroneal artery.  There was a large geniculate collateral that arose from the P3 segment and partially vascularized the tibial vessels  IMPRESSION: Gina Carter has multi-level disease including SFA, popliteal and tibial vessels with critical limb ischemia.  We will proceed with intervention on the popliteal and SFA  Final Impression: Successful drug eluting stenting of a left popliteal CTO as well as chocolate balloon angioplasty and drug-coated balloon angioplasty of segmental high-grade mid left SFA stenosis for critical limb ischemia.  She has occluded tibial vessels with 1 patent collateral.  I suspect that her circulation to her foot has improved although she does not have inline flow.  I do not think she has additional endovascular options.  The sheath will be removed once the ACT falls below 170 pressure will be held.  Patient left lab in stable condition.  She will be hydrated overnight, discharged home in the morning on aspirin and Plavix.  We will obtain lower extremity arterial Doppler studies in our [facility] line office next week and I will see her back in 2 to 3 weeks thereafter.  Gina Carter. MD, [facility] _____________   History of Present Illness      Gina Carter is a 78 y.o. with no significant PMH who was referred to Dr. Allyson Carter for  peripheral vascular valuation because of critical limb ischemia and rest pain.  She has no cardiac risk factors. She was referred by Dr. Apolonio Carter to Dr. Antoine Carter because of this.  She had dependent rubor and pain in her left foot for several weeks prior to this office visit.  There were no open wounds.  She could not walk with this.  Dopplers performed 01/14/2019 revealed high-grade mid left SFA stenosis with 0 vessel runoff.  Her right ABI was 0.85 and left was 0.38. Given findings she was set up for outpatient PV angiogram.   Hospital Course     Underwent PV angiogram noted above with successful drug eluting stenting of the left popliteal CTO with chocolate balloon angioplasty and drug-coated balloon angioplasty of segmental high-grade mid left SFA stenosis for critical limb ischemia. Also noted to have occluded tibial vessels with one patent collateral. Plan for DAPT with ASA/plavix post cath. She was hydrated overnight with stable labs the following morning. No complications noted overnight. Able to ambulate without difficulty. Statin added post cath prior to discharge.    Gina Carter was seen by Gina Carter and determined stable for discharge home. Follow up in the office has been arranged. Medications are listed below.   _____________  Discharge Vitals Blood pressure 122/61, pulse 81, temperature 99.3 F (37.4 C), temperature source Oral, resp. rate (!) 22, height  (1.702 m), weight 62.4 kg, SpO2 99 %.  Filed Weights   01/18/19 0944 01/19/19 0545  Weight: 57.6 kg 62.4 kg    Labs & Radiologic Studies    CBC  Recent Labs    01/16/19 1555 01/19/19 0404  WBC 9.8 9.6  HGB 12.4 10.1*  HCT 38.0 30.7*  MCV 93 93.6  PLT 462* 374   Basic Metabolic Panel Recent Labs    40/98/1105/26/20 1555 01/19/19 0404  NA 142 138  K 4.8 4.0  CL 101 106  CO2 25 23  GLUCOSE 118* 129*  BUN 8 11  CREATININE 0.70 0.75  CALCIUM 10.4* 8.9   Liver Function Tests No results for input(s): AST, ALT,  ALKPHOS, BILITOT, PROT, ALBUMIN in the last 72 hours. No results for input(s): LIPASE, AMYLASE in the last 72 hours. Cardiac Enzymes No results for input(s): CKTOTAL, CKMB, CKMBINDEX, TROPONINI in the last 72 hours. BNP Invalid input(s): POCBNP D-Dimer No results for input(s): DDIMER in the last 72 hours. Hemoglobin A1C No results for input(s): HGBA1C in the last 72 hours. Fasting Lipid Panel No results for input(s): CHOL, HDL, LDLCALC, TRIG, CHOLHDL, LDLDIRECT in the last 72 hours. Thyroid Function Tests No results for input(s): TSH, T4TOTAL, T3FREE, THYROIDAB in the last 72 hours.  Invalid input(s): FREET3 _____________  Vas [place] Le Art Seg Multi (segm&le Reynauds)  Result Date: 01/14/2019 LOWER EXTREMITY DOPPLER STUDY Indications: Peripheral artery disease, and Patient complains of redness and              severe pain in her left foot for several days. She has trouble              ambulating since this occured. Her toe is sensitive to touch. High Risk Factors: None.  Performing Technologist: Gina Carter RVT  Examination Guidelines: A complete evaluation includes at minimum, Doppler waveform signals and systolic blood pressure reading at the level of bilateral brachial, anterior tibial, and posterior tibial arteries, when vessel segments are accessible. Bilateral testing is considered an integral part of a complete examination. Photoelectric Plethysmograph (PPG) waveforms and toe systolic pressure readings are included as required and additional duplex testing as needed. Limited examinations for reoccurring indications may be performed as noted.  ABI Findings: +---------+------------------+-----+----------+--------+  Right     Rt Pressure (mmHg) Index Waveform   Comment   +---------+------------------+-----+----------+--------+  Brachial  216                                           +---------+------------------+-----+----------+--------+  CFA                                triphasic             +---------+------------------+-----+----------+--------+  Popliteal                          triphasic            +---------+------------------+-----+----------+--------+  ATA       177                0.82  monophasic           +---------+------------------+-----+----------+--------+  PTA       180                0.83  monophasic           +---------+------------------+-----+----------+--------+  PERO      183  0.85  monophasic           +---------+------------------+-----+----------+--------+  Great Toe 100                0.46  Abnormal             +---------+------------------+-----+----------+--------+ +---------+------------------+-----+----------+-------+  Left      Lt Pressure (mmHg) Index Waveform   Comment  +---------+------------------+-----+----------+-------+  Brachial  195                                          +---------+------------------+-----+----------+-------+  CFA                                biphasic            +---------+------------------+-----+----------+-------+  Popliteal                          monophasic          +---------+------------------+-----+----------+-------+  ATA       73                 0.34  monophasic          +---------+------------------+-----+----------+-------+  PTA       81                 0.38  monophasic          +---------+------------------+-----+----------+-------+  PERO      75                 0.35  monophasic          +---------+------------------+-----+----------+-------+  Great Toe 0                  0.00  Abnormal            +---------+------------------+-----+----------+-------+ +-------+-----------+-----------+  ABI/TBI Today's ABI Today's TBI  +-------+-----------+-----------+  Right   0.85        0.46         +-------+-----------+-----------+  Left    0.38        0.0          +-------+-----------+-----------+  Summary: Right: Resting right ankle-brachial index indicates mild right lower extremity arterial disease. The right toe-brachial  index is abnormal. Left: Resting left ankle-brachial index indicates severe left lower extremity arterial disease. The left toe-brachial index is abnormal.  *See table(s) above for measurements and observations.  Vascular consult recommended. Electronically signed by Dina Rich MD on 01/14/2019 at 6:45:04 PM.    Final    Vas [place] Lower Extremity Arterial Duplex  Result Date: 01/14/2019 LOWER EXTREMITY ARTERIAL DUPLEX STUDY Indications: Peripheral artery disease, and Patient complains of redness and              severe pain in her left foot for several days. She has trouble              ambulating since this occured. Her toe is sensitive to touch. High Risk Factors: None.  Current ABI: Today the right ABI was 0.85 and left 0.38 Performing Technologist: Gina Prader RVT  Examination Guidelines: A complete evaluation includes B-mode imaging, spectral Doppler, color Doppler, and power Doppler as needed of all accessible portions of each vessel. Bilateral testing is considered an integral part of a complete examination.  Limited examinations for reoccurring indications may be performed as noted.  +----------+--------+-----+---------------+---------+--------+  RIGHT      PSV cm/s Ratio Stenosis        Waveform  Comments  +----------+--------+-----+---------------+---------+--------+  CFA Prox   162                            triphasic           +----------+--------+-----+---------------+---------+--------+  CFA Distal 119                            triphasic           +----------+--------+-----+---------------+---------+--------+  DFA        168                            triphasic           +----------+--------+-----+---------------+---------+--------+  SFA Prox   121                            triphasic           +----------+--------+-----+---------------+---------+--------+  SFA Mid    232            30-49% stenosis triphasic           +----------+--------+-----+---------------+---------+--------+  SFA Distal 225             50-74% stenosis triphasic           +----------+--------+-----+---------------+---------+--------+  POP Prox   48                             biphasic            +----------+--------+-----+---------------+---------+--------+  POP Distal 60                             biphasic            +----------+--------+-----+---------------+---------+--------+  TP Trunk   56                             biphasic            +----------+--------+-----+---------------+---------+--------+  ATA Prox   35                             biphasic            +----------+--------+-----+---------------+---------+--------+  PTA Prox   14                             biphasic            +----------+--------+-----+---------------+---------+--------+  PERO Prox                 occluded                            +----------+--------+-----+---------------+---------+--------+ A focal velocity elevation of 232 cm/s was obtained at Mid SFA with post stenotic turbulence with a VR of 1.5. Findings are characteristic of 30-49% stenosis. A 2nd focal velocity elevation was visualized, measuring 225 cm/s at Distal SFA  with post stenotic turbulence with a VR of 3.0. Findings are characteristic of 50-74% stenosis.  +----------+--------+-----+---------------+----------+------------------+  LEFT       PSV cm/s Ratio Stenosis        Waveform   Comments            +----------+--------+-----+---------------+----------+------------------+  CFA Prox   158                            triphasic                      +----------+--------+-----+---------------+----------+------------------+  CFA Distal 116                            triphasic                      +----------+--------+-----+---------------+----------+------------------+  DFA        82                             biphasic                       +----------+--------+-----+---------------+----------+------------------+  SFA Prox   76                             triphasic                       +----------+--------+-----+---------------+----------+------------------+  SFA Mid    322      3.4   50-74% stenosis biphasic   Per velocity ratio  +----------+--------+-----+---------------+----------+------------------+  SFA Distal 26                             biphasic                       +----------+--------+-----+---------------+----------+------------------+  POP Prox   33                             monophasic                     +----------+--------+-----+---------------+----------+------------------+  POP Distal 29                             monophasic                     +----------+--------+-----+---------------+----------+------------------+  TP Trunk   53                             monophasic                     +----------+--------+-----+---------------+----------+------------------+  ATA Prox                  occluded                                       +----------+--------+-----+---------------+----------+------------------+  PTA Prox  occluded                                       +----------+--------+-----+---------------+----------+------------------+  PERO Prox                 occluded                                       +----------+--------+-----+---------------+----------+------------------+ A focal velocity elevation of 322 cm/s was obtained at [employer] SFA with post stenotic turbulence with a VR of 3.4. Findings are characteristic of 50-74% stenosis.  Summary: Right: Atherosclerosis in the common femoral, femoral, popliteal and tibial arteries. 30-49% stenosis in the mid SFA. 50-74% stenosis in the distal SFA. Two vessel run off with a probable occlusion of the peroneal artery. Left: Atherosclerosis in the common femoral, femoral, popliteal and tibial arteries. 50-74% stenosis in the SFA. Posterior tibial, peroneal and anterior tibial arteries appear to be occluded.  See table(s) above for measurements and observations. Vascular consult recommended. Appt. with Dr. Antoine Carter  today. Electronically signed by Dina Rich MD on 01/14/2019 at 6:44:23 PM.    Final    Disposition   Pt is being discharged home today in good condition.  Follow-up Plans & Appointments    Follow-up Information    [facility] Follow up on 02/05/2019.   Specialty:  Cardiology Why:  at 9am for your follow up dopplers in office.  Contact information: [address] [place] [address] [phone]       Runell Gess, MD Follow up on 02/08/2019.   Specialties:  Cardiology, Radiology Why:  at 10:15am for your follow up appt. This will be a virtual visit through your mobile phone.  Contact information: [address] [place] [address] [phone]          Discharge Instructions    Diet - low sodium heart healthy   Complete by:  As directed    Discharge instructions   Complete by:  As directed    Groin Site Care Refer to this sheet in the next few weeks. These instructions provide you with information on caring for yourself after your procedure. Your caregiver may also give you more specific instructions. Your treatment has been planned according to current medical practices, but problems sometimes occur. Call your caregiver if you have any problems or questions after your procedure. HOME CARE INSTRUCTIONS You may shower 24 hours after the procedure. Remove the bandage (dressing) and gently wash the site with plain soap and water. Gently pat the site dry.  Do not apply powder or lotion to the site.  Do not sit in a bathtub, swimming pool, or whirlpool for 5 to 7 days.  No bending, squatting, or lifting anything over 10 pounds (4.5 kg) as directed by your caregiver.  Inspect the site at least twice daily.  Do not drive home if you are discharged the same day of the procedure. Have someone else drive you.  You may drive 24 hours after the procedure unless otherwise instructed by your caregiver.  What to  expect: Any bruising will usually fade within 1 to 2 weeks.  Blood that collects in the tissue (hematoma) may be painful to the touch. It should usually decrease in size and tenderness within 1 to 2 weeks.  SEEK  IMMEDIATE MEDICAL CARE IF: You have unusual pain at the groin site or down the affected leg.  You have redness, warmth, swelling, or pain at the groin site.  You have drainage (other than a small amount of blood on the dressing).  You have chills.  You have a fever or persistent symptoms for more than 72 hours.  You have a fever and your symptoms suddenly get worse.  Your leg becomes pale, cool, tingly, or numb.  You have heavy bleeding from the site. Hold pressure on the site. Marland Kitchen  PLEASE DO NOT MISS ANY DOSES OF YOUR PLAVIX!!!!! Also keep a log of you blood pressures and bring back to your follow up appt. Please call the office with any questions.   Patients taking blood thinners should generally stay away from medicines like ibuprofen, Advil, Motrin, naproxen, and Aleve due to risk of stomach bleeding. You may take Tylenol as directed or talk to your primary doctor about alternatives.   Increase activity slowly   Complete by:  As directed       Discharge Medications     Medication List    TAKE these medications   acetaminophen 500 MG tablet Commonly known as:  TYLENOL Take 500 mg by mouth every 6 (six) hours as needed for moderate pain or headache.   amoxicillin-clavulanate 500-125 MG tablet Commonly known as:  AUGMENTIN Take 1 tablet by mouth 2 (two) times a day.   aspirin EC 81 MG tablet Take 81 mg by mouth daily.   atorvastatin 80 MG tablet Commonly known as:  LIPITOR Take 1 tablet (80 mg total) by mouth daily at 6 PM.   clopidogrel 75 MG tablet Commonly known as:  PLAVIX Take 1 tablet (75 mg total) by mouth daily with breakfast.   diphenhydrAMINE 2 % cream Commonly known as:  BENADRYL Apply 1 application topically 3 (three) times daily as needed for  itching.        Acute coronary syndrome (MI, NSTEMI, STEMI, etc) this admission?: No.     Outstanding Labs/Studies   Follow up dopplers. FLP/LFTs in 6 weeks.   Duration of Discharge Encounter   Greater than 30 minutes including physician time.  Signed, Laverda Page NP-C 01/19/2019, 8:56 AM Patient seen and examined and history reviewed. Agree with above findings and plan. She is doing well this am. She has a small hematoma at cath site on right. VSS. Lungs are clear. No gallop or murmur. Left foot with persistent rubor.  Labs are stable.   Patient is stable for DC with plans noted above.   Kanetra Ho Carter, MDFACC 01/19/2019 9:45 AM

## 2019-01-22 ENCOUNTER — Telehealth: Payer: Self-pay | Admitting: Cardiovascular Disease

## 2019-01-22 NOTE — Telephone Encounter (Signed)
Pt had L popliteal stent placed 01/18/19 and angioplasty of the SFA.Marland Kitchen  She is calling to report that her left foot and toes have been tingling for a few days especially after she just gets up but seems to improve when she is up and about.. it was mildly swollen and red on the top of her foot this morning but since she has been up walking it has improved. No claudication... she is asking if the "tingling" is expected...   Repeat LED 02/05/19 Follow up with Dr. Allyson Sabal 02/08/19..  Will forward to Dr. Allyson Sabal for his review.   Call pt back at [phone].   Addendum... pt advised some pain and tingling can be normal with increased blood flow returning to the extremist.. to continue to monitor.Marland Kitchen keep LED 02/05/19 and call if any changes or worsens. Pt agreed.

## 2019-01-22 NOTE — Telephone Encounter (Signed)
F/U Message         Patient would like a call back she has more questions, Ok to speak with Eber Jones.

## 2019-01-22 NOTE — Telephone Encounter (Signed)
Pt daughter calling back to ask if she can take a stool softener or a laxative pt has not had a BM for 4-5 days.. it seemed to worsen after her 01/18/19 procedure... I have advised them they could talk with their retial Pharmacist but they were really wanting to know so they can send someone to the store for them... will forward to our [facility] to see if they can recommend an OTC they could try or have them look for.  I have also advised them to be sure that she is drinking enough fluids.

## 2019-01-22 NOTE — Telephone Encounter (Signed)
Pts cousin.... Eber Jones advised and was appreciative for the recommendation.

## 2019-01-22 NOTE — Telephone Encounter (Signed)
Have her try Miarlax (generic is fine) and do 1 dose twice daily for 2-3 days, this should help her have BM.

## 2019-01-22 NOTE — Telephone Encounter (Signed)
New Message   Patient's daughter calling due to mother having stent placed in right leg and she's reporting a tingling sensation in her foot now.  Patients daughter wants to know if that's normal.

## 2019-01-25 NOTE — Addendum Note (Signed)
Addended by: Recardo Evangelist L on: 01/25/2019 11:20 AM   Modules accepted: Orders

## 2019-01-30 ENCOUNTER — Telehealth: Payer: Self-pay | Admitting: Cardiovascular Disease

## 2019-01-30 NOTE — Telephone Encounter (Signed)
Spoke with daughter who reports patient had PV procedure on LLE on 5/28. She has been having issues with toes on her left foot for about 2 weeks (see call 6/1). Patient is doing fine if she is resting but her toes swell & tingle if she is up walking on them.   Yesterday patient went to podiatrist and had bump on big toe lanced d/t pus, per report from daughter.  Last night, patient told daughter that she had a bump on her second toe that was swollen & looked like there was pus.  This AM, patient told daughter her foot looked a little better but daughter states patient has memory problems and also does not want her family to worry.   Daughter would like recommendation from MD. Possible to move 6/15 LEA appointment to sooner date to evaluate?   Routed to MD & RN and daughter would like a call back

## 2019-01-30 NOTE — Telephone Encounter (Signed)
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  When is her in office return visit scheduled for?

## 2019-01-30 NOTE — Telephone Encounter (Signed)
New Message:    Pt had a stent put in on 01-18-19. Now she is having some issues with her toes for the past 2 weeks. Daughter is very concerned about this, would like an appointment.

## 2019-01-30 NOTE — Telephone Encounter (Signed)
Spoke with pt daughter Santiago Glad. Informed her that appt can be rescheduled from 6/18 to 6/12 at 1:45pm. She will bring pt for new OV appt time and date. Pt daughter not sure if pt should keep next podiatrist appt which will be virtual. Advised to keep and still f/u with Dr. Gwenlyn Found on 6/12. She verbalized understanding.

## 2019-02-02 ENCOUNTER — Telehealth: Payer: Self-pay | Admitting: Cardiovascular Disease

## 2019-02-02 ENCOUNTER — Other Ambulatory Visit: Payer: Self-pay

## 2019-02-02 ENCOUNTER — Ambulatory Visit (INDEPENDENT_AMBULATORY_CARE_PROVIDER_SITE_OTHER): Payer: Medicare Other | Admitting: Cardiovascular Disease

## 2019-02-02 ENCOUNTER — Encounter: Payer: Self-pay | Admitting: Cardiovascular Disease

## 2019-02-02 DIAGNOSIS — I70229 Atherosclerosis of native arteries of extremities with rest pain, unspecified extremity: Secondary | ICD-10-CM

## 2019-02-02 DIAGNOSIS — Z959 Presence of cardiac and vascular implant and graft, unspecified: Secondary | ICD-10-CM | POA: Diagnosis not present

## 2019-02-02 DIAGNOSIS — I998 Other disorder of circulatory system: Secondary | ICD-10-CM | POA: Diagnosis not present

## 2019-02-02 NOTE — Assessment & Plan Note (Signed)
History of critical limb ischemia status post angiography and intervention by myself 01/10/2019.  She had stenting of a distal right and left SFA CTO using a Eluvia 6 mm x 120 mm long drug-eluting stent followed by PTA of a 80% segmental proximal to mid left SFA stenosis.  She had 0 vessel runoff below the knee with a collateral vessel that arose from the popliteal artery vascularizing the dorsalis pedis.  Today her foot feels better than it did prior to intervention.  It is warm.  I can help a pedal pulses.  She is scheduled for Dopplers next week.  She is on clopidogrel.

## 2019-02-02 NOTE — Telephone Encounter (Signed)
°*  STAT* If patient is at the pharmacy, call can be transferred to refill team.   1. Which medications need to be refilled? (please list name of each medication and dose if known) amoxicillin-clavulanate 500-12 mg  2. Which pharmacy/location (including street and city if local pharmacy) is medication to be sent to? [facility] [place]  3. Do they need a 30 day or 90 day supply? 30?  They would like a call back when done.

## 2019-02-02 NOTE — Telephone Encounter (Signed)
No reason to continue.

## 2019-02-02 NOTE — Patient Instructions (Signed)
Medication Instructions:  Your physician recommends that you continue on your current medications as directed. Please refer to the Current Medication list given to you today.  If you need a refill on your cardiac medications before your next appointment, please call your pharmacy.   Lab work: NONE If you have labs (blood work) drawn today and your tests are completely normal, you will receive your results only by: Marland Kitchen MyChart Message (if you have MyChart) OR . A paper copy in the mail If you have any lab test that is abnormal or we need to change your treatment, we will call you to review the results.  Testing/Procedures: Your physician has requested that you have a lower or upper extremity arterial duplex. This test is an ultrasound of the arteries in the legs or arms. It looks at arterial blood flow in the legs and arms. Allow one hour for Lower and Upper Arterial scans. There are no restrictions or special instructions SCHEDULED FOR 02/05/2019 AT 9:00AM  Follow-Up: At [facility], you and your health needs are our priority.  As part of our continuing mission to provide you with exceptional heart care, we have created designated Provider Care Teams.  These Care Teams include your primary Cardiologist (physician) and Advanced Practice Providers (APPs -  Physician Assistants and Nurse Practitioners) who all work together to provide you with the care you need, when you need it. You will need a follow up appointment in 3 MONTHS with Dr. Gwenlyn Found.  Please call our office 2 months in advance to schedule this appointment.

## 2019-02-02 NOTE — Progress Notes (Signed)
02/02/2019 Scarlette Ar   [birth year]  [id number]  Primary Physician Patient, No Pcp Per Primary Cardiologist: Lorretta Harp MD Garret Reddish, [place], [place]  HPI:  Meztli Llanas is a 78 y.o.  thin appearing widowed African-American female mother of 27, grandmother of 6 grandchildren referred to me by Dr. Percival Spanish for peripheral vascular valuation because of critical limb ischemia and rest pain.  She has no cardiac risk factors.  I last saw her in the office 01/16/2019. She was referred by Dr. Fritzi Mandes to Dr. Percival Spanish because of this.  She has had dependent rubor and pain in her left foot for several weeks.  There are no open wounds.  She cannot walk with this.  Dopplers performed 01/14/2019 revealed high-grade mid left SFA stenosis with 0 vessel runoff.  Her right ABI was 0.85 and left was 0.38.  I performed peripheral angiography and intervention on her 01/15/2019 stenting of a distal left SFA CTO with a drug-eluting stent perform angioplasty on a high-grade segmental proximal left SFA.  She had 0 vessel runoff below the knee with a large collateral vessel originating from the popliteal artery to the dorsalis pedis.  She says that her foot feels better.  She does have some edema in her foot probably related to increased blood flow is related to the intervention.  She does have a small blister on her left great toe.  Her foot is warm and she has palpable pedal pulses.   No outpatient medications have been marked as taking for the 02/02/19 encounter (Office Visit) with Lorretta Harp, MD.     No Known Allergies  Social History   Socioeconomic History  . Marital status: Widowed    Spouse name: Not on file  . Number of children: Not on file  . Years of education: Not on file  . Highest education level: Not on file  Occupational History  . Not on file  Social Needs  . Financial resource strain: Not on file  . Food insecurity    Worry: Not on file    Inability: Not on file  .  Transportation needs    Medical: Not on file    Non-medical: Not on file  Tobacco Use  . Smoking status: Never Smoker  . Smokeless tobacco: Never Used  Substance and Sexual Activity  . Alcohol use: Never    Frequency: Never  . Drug use: Not on file  . Sexual activity: Not on file  Lifestyle  . Physical activity    Days per week: Not on file    Minutes per session: Not on file  . Stress: Not on file  Relationships  . Social Herbalist on phone: Not on file    Gets together: Not on file    Attends religious service: Not on file    Active member of club or organization: Not on file    Attends meetings of clubs or organizations: Not on file    Relationship status: Not on file  . Intimate partner violence    Fear of current or ex partner: Not on file    Emotionally abused: Not on file    Physically abused: Not on file    Forced sexual activity: Not on file  Other Topics Concern  . Not on file  Social History Narrative   Lives alone.  Three children.       Review of Systems: General: negative for chills, fever, night sweats or weight changes.  Cardiovascular:  negative for chest pain, dyspnea on exertion, edema, orthopnea, palpitations, paroxysmal nocturnal dyspnea or shortness of breath Dermatological: negative for rash Respiratory: negative for cough or wheezing Urologic: negative for hematuria Abdominal: negative for nausea, vomiting, diarrhea, bright red blood per rectum, melena, or hematemesis Neurologic: negative for visual changes, syncope, or dizziness All other systems reviewed and are otherwise negative except as noted above.    Blood pressure 130/80, pulse 85, temperature 97.7 F (36.5 C), height 5\' 6"  (1.676 m), weight 129 lb (58.5 kg), SpO2 96 %.  General appearance: alert and no distress Neck: no adenopathy, no carotid bruit, no JVD, supple, symmetrical, trachea midline and thyroid not enlarged, symmetric, no tenderness/mass/nodules Lungs: clear to  auscultation bilaterally Heart: regular rate and rhythm, S1, S2 normal, no murmur, click, rub or gallop Extremities: There was some mild edema in the left foot and a small blister on her left great toe Pulses: Her left foot is warm and she has palpable pedal pulses Skin: Skin color, texture, turgor normal. No rashes or lesions Neurologic: Alert and oriented X 3, normal strength and tone. Normal symmetric reflexes. Normal coordination and gait  EKG not performed today  ASSESSMENT AND PLAN:   Critical limb ischemia with history of revascularization of same extremity History of critical limb ischemia status post angiography and intervention by myself 01/10/2019.  She had stenting of a distal right and left SFA CTO using a Eluvia 6 mm x 120 mm long drug-eluting stent followed by PTA of a 80% segmental proximal to mid left SFA stenosis.  She had 0 vessel runoff below the knee with a collateral vessel that arose from the popliteal artery vascularizing the dorsalis pedis.  Today her foot feels better than it did prior to intervention.  It is warm.  I can help a pedal pulses.  She is scheduled for Dopplers next week.  She is on clopidogrel.      Runell GessJonathan J.  MD FACP,FACC,FAHA, [facility] 02/02/2019 2:19 PM

## 2019-02-02 NOTE — Telephone Encounter (Signed)
Called back, she states that she has no more of the Augmentin and it says to take 2 tablets daily- and she has no more, is she suppose to continue this or only use it until it was finished, she asked that I check with Dr.Berry.  Route to MD and nurse.

## 2019-02-05 ENCOUNTER — Encounter (HOSPITAL_COMMUNITY): Payer: Medicare Other

## 2019-02-05 ENCOUNTER — Ambulatory Visit (HOSPITAL_COMMUNITY)
Admission: RE | Admit: 2019-02-05 | Discharge: 2019-02-05 | Disposition: A | Discharge status: Home / Self Care | Payer: Medicare Other | Source: Ambulatory Visit | Attending: Internal Medicine | Admitting: Internal Medicine

## 2019-02-05 ENCOUNTER — Other Ambulatory Visit: Payer: Self-pay

## 2019-02-05 ENCOUNTER — Other Ambulatory Visit: Payer: Self-pay | Admitting: Cardiovascular Disease

## 2019-02-05 DIAGNOSIS — I998 Other disorder of circulatory system: Secondary | ICD-10-CM | POA: Insufficient documentation

## 2019-02-05 DIAGNOSIS — Z959 Presence of cardiac and vascular implant and graft, unspecified: Secondary | ICD-10-CM | POA: Diagnosis not present

## 2019-02-05 DIAGNOSIS — I70229 Atherosclerosis of native arteries of extremities with rest pain, unspecified extremity: Secondary | ICD-10-CM

## 2019-02-05 NOTE — Progress Notes (Signed)
Notes recorded by Lorretta Harp, MD on 02/05/2019 at 10:36 AM EDT  Improved ABI status post intervention on left. Repeat 6 months Critical limb ischemia

## 2019-02-07 NOTE — Telephone Encounter (Signed)
Called [phone] and [phone]. Phone continues to ring with no answer or VM

## 2019-02-08 ENCOUNTER — Telehealth: Payer: Medicare Other | Admitting: Cardiovascular Disease

## 2019-03-01 NOTE — Addendum Note (Signed)
Addended by: Meryl Crutch on: 03/01/2019 03:54 PM   Modules accepted: Orders

## 2019-03-01 NOTE — Telephone Encounter (Signed)
Pt updated and voiced understanding. Augmentin d/c.

## 2019-05-01 ENCOUNTER — Telehealth: Payer: Self-pay | Admitting: Cardiovascular Disease

## 2019-05-01 NOTE — Telephone Encounter (Signed)
Sent to primary nurse to advise patient/daughter

## 2019-05-01 NOTE — Telephone Encounter (Signed)
Patient's daughter would like to come with her mother to her appt tomorrow 9/9. She states he mother is forgetful and doesn't remember what was said at the appt.

## 2019-05-01 NOTE — Telephone Encounter (Signed)
LVM STATING PT DAUGHTER OK TO ACCOMPANY PT D/T PT MEMORY ISSUES

## 2019-05-02 ENCOUNTER — Other Ambulatory Visit: Payer: Self-pay

## 2019-05-02 ENCOUNTER — Ambulatory Visit (INDEPENDENT_AMBULATORY_CARE_PROVIDER_SITE_OTHER): Payer: Medicare Other | Admitting: Cardiovascular Disease

## 2019-05-02 ENCOUNTER — Encounter: Payer: Self-pay | Admitting: Cardiovascular Disease

## 2019-05-02 VITALS — BP 176/83 | HR 63 | Ht 66.0 in | Wt 125.4 lb

## 2019-05-02 DIAGNOSIS — E785 Hyperlipidemia, unspecified: Secondary | ICD-10-CM | POA: Insufficient documentation

## 2019-05-02 DIAGNOSIS — Z959 Presence of cardiac and vascular implant and graft, unspecified: Secondary | ICD-10-CM

## 2019-05-02 DIAGNOSIS — I998 Other disorder of circulatory system: Secondary | ICD-10-CM | POA: Diagnosis not present

## 2019-05-02 DIAGNOSIS — Z79899 Other long term (current) drug therapy: Secondary | ICD-10-CM | POA: Diagnosis not present

## 2019-05-02 DIAGNOSIS — E782 Mixed hyperlipidemia: Secondary | ICD-10-CM | POA: Diagnosis not present

## 2019-05-02 DIAGNOSIS — I70229 Atherosclerosis of native arteries of extremities with rest pain, unspecified extremity: Secondary | ICD-10-CM

## 2019-05-02 MED ORDER — LOSARTAN POTASSIUM 25 MG PO TABS: 25.0000 mg | ORAL_TABLET | Freq: Every day | ORAL | 3 refills | Status: DC

## 2019-05-02 NOTE — Assessment & Plan Note (Signed)
History of critical limb ischemia with dependent rubor and pain in her left foot status post intervention by myself 01/18/2019 with stenting of distal left SFA CTO using an Eluvia  6 mm x 120 mm long drug-eluting stent as well as intervention on her proximal left SFA.  She had 0 vessel runoff but a large geniculate collateral that arose from the popliteal artery to the distal anterior tibial.  Her symptoms markedly improved.  She has a small crusted over wound on the dorsal aspect of her left second toe but the pain and rubor have significantly improved.  Her follow-up Doppler studies showed patent SFA stent with improved ABIs.  She is on aspirin and Plavix.  We will repeat lower extremity arterial Doppler studies in 6 months

## 2019-05-02 NOTE — Assessment & Plan Note (Signed)
History of hyperlipidemia on statin therapy.  We will recheck a lipid liver profile 

## 2019-05-02 NOTE — Patient Instructions (Addendum)
Medication Instructions:  Your physician has recommended you make the following change in your medication:  START LOSARTAN (COZAAR) 25 MG BY MOUTH DAILY  If you need a refill on your cardiac medications before your next appointment, please call your pharmacy.   Lab work: Your physician recommends that you return for lab work WITHIN 1-2 WEEKS: FASTING LIPID AND LIVER PANELS  Your physician recommends that you return for lab work IN 2 WEEKS: [place]  If you have labs (blood work) drawn today and your tests are completely normal, you will receive your results only by: Marland Kitchen MyChart Message (if you have MyChart) OR . A paper copy in the mail If you have any lab test that is abnormal or we need to change your treatment, we will call you to review the results.  Testing/Procedures: Your physician has requested that you have a lower or upper extremity arterial duplex. This test is an ultrasound of the arteries in the legs or arms. It looks at arterial blood flow in the legs and arms. Allow one hour for Lower and Upper Arterial scans. There are no restrictions or special instructions TO BE REPEATED IN 6 MONTHS  Your physician has requested that you have an ankle brachial index (ABI). During this test an ultrasound and blood pressure cuff are used to evaluate the arteries that supply the arms and legs with blood. Allow thirty minutes for this exam. There are no restrictions or special instructions. TO BE REPEATED IN 6 MONTHS  Follow-Up: At [facility], you and your health needs are our priority.  As part of our continuing mission to provide you with exceptional heart care, we have created designated Provider Care Teams.  These Care Teams include your primary Cardiologist (physician) and Advanced Practice Providers (APPs -  Physician Assistants and Nurse Practitioners) who all work together to provide you with the care you need, when you need it. You will need a follow up appointment in 6  months with Dr. Quay Burow.  Please call our office 2 months in advance to schedule this appointment.  Please have your ultrasounds completed before this appointment.   ADDITIONAL INFORMATION:  KEEP A BLOOD PRESSURE LOG FOR 30 DAYS THEN FOLLOW UP WITH A CLINICAL PHARMACIST IN THE HYPERTENSION CLINIC. YOU WILL NEED AN APPOINTMENT.

## 2019-05-02 NOTE — Progress Notes (Signed)
05/02/2019 Gina MaterMargie Spegal   October 02, 1940  [id number]  Primary Physician Patient, No Pcp Per Primary Cardiologist: Runell GessJonathan J Deara Bober MD Milagros LollFACP, FACC, [place], [place]  HPI:  Gina Carter is a 78 y.o.  thin appearing widowed African-American female mother of 3, grandmother of 6 grandchildren referred to me by Dr. Antoine PocheHochrein for peripheral vascular valuation because of critical limb ischemia and rest pain. She has no cardiac risk factors.  I last saw her in the office 02/02/2019.She was referred by Dr. Harriet PhoAjlouny to Dr. Antoine PocheHochrein because of this. She has had dependent rubor and pain in her left foot for several weeks. There are no open wounds. She cannot walk with this. Dopplers performed 01/14/2019 revealed high-grade mid left SFA stenosis with 0 vessel runoff. Her right ABI was 0.85 and left was 0.38.  I performed peripheral angiography and intervention on her 01/15/2019 stenting of a distal left SFA CTO with a drug-eluting stent perform angioplasty on a high-grade segmental proximal left SFA.  She had 0 vessel runoff below the knee with a large collateral vessel originating from the popliteal artery to the dorsalis pedis.  She says that her foot feels better.  She did have some edema in her foot probably related to increased blood flow is related to the intervention.  She does have a small blister on her left great toe.  Her foot is warm and she had barely palpable pedal pulses.  Since I saw her 3 months ago she continues to do well.  Her follow-up Dopplers performed 02/05/2019 revealed a patent left SFA stent with improvement in her left ABI.  She has some mild dependent rubor but denies pain.  She has a small eschar on the dorsal aspect of her left second toe.   Current Meds  Medication Sig  . acetaminophen (TYLENOL) 500 MG tablet Take 500 mg by mouth every 6 (six) hours as needed for moderate pain or headache.  Marland Kitchen. aspirin EC 81 MG tablet Take 81 mg by mouth daily.   Marland Kitchen. atorvastatin (LIPITOR) 80 MG  tablet Take 1 tablet (80 mg total) by mouth daily at 6 PM.  . clopidogrel (PLAVIX) 75 MG tablet Take 1 tablet (75 mg total) by mouth daily with breakfast.  . diphenhydrAMINE (BENADRYL) 2 % cream Apply 1 application topically 3 (three) times daily as needed for itching.     No Known Allergies  Social History   Socioeconomic History  . Marital status: Widowed    Spouse name: Not on file  . Number of children: Not on file  . Years of education: Not on file  . Highest education level: Not on file  Occupational History  . Not on file  Social Needs  . Financial resource strain: Not on file  . Food insecurity    Worry: Not on file    Inability: Not on file  . Transportation needs    Medical: Not on file    Non-medical: Not on file  Tobacco Use  . Smoking status: Never Smoker  . Smokeless tobacco: Never Used  Substance and Sexual Activity  . Alcohol use: Never    Frequency: Never  . Drug use: Not on file  . Sexual activity: Not on file  Lifestyle  . Physical activity    Days per week: Not on file    Minutes per session: Not on file  . Stress: Not on file  Relationships  . Social connections    Talks on phone: Not on file  Gets together: Not on file    Attends religious service: Not on file    Active member of club or organization: Not on file    Attends meetings of clubs or organizations: Not on file    Relationship status: Not on file  . Intimate partner violence    Fear of current or ex partner: Not on file    Emotionally abused: Not on file    Physically abused: Not on file    Forced sexual activity: Not on file  Other Topics Concern  . Not on file  Social History Narrative   Lives alone.  Three children.       Review of Systems: General: negative for chills, fever, night sweats or weight changes.  Cardiovascular: negative for chest pain, dyspnea on exertion, edema, orthopnea, palpitations, paroxysmal nocturnal dyspnea or shortness of breath Dermatological:  negative for rash Respiratory: negative for cough or wheezing Urologic: negative for hematuria Abdominal: negative for nausea, vomiting, diarrhea, bright red blood per rectum, melena, or hematemesis Neurologic: negative for visual changes, syncope, or dizziness All other systems reviewed and are otherwise negative except as noted above.    Blood pressure (!) 162/78, pulse 67, height 5\' 6"  (1.676 m), weight 125 lb 6.4 oz (56.9 kg).  General appearance: alert and no distress Neck: no adenopathy, no carotid bruit, no JVD, supple, symmetrical, trachea midline and thyroid not enlarged, symmetric, no tenderness/mass/nodules Lungs: clear to auscultation bilaterally Heart: regular rate and rhythm, S1, S2 normal, no murmur, click, rub or gallop Extremities: extremities normal, atraumatic, no cyanosis or edema Pulses: 2+ and symmetric Skin: Skin color, texture, turgor normal. No rashes or lesions Neurologic: Alert and oriented X 3, normal strength and tone. Normal symmetric reflexes. Normal coordination and gait  EKG not performed today  ASSESSMENT AND PLAN:   Critical limb ischemia with history of revascularization of same extremity History of critical limb ischemia with dependent rubor and pain in her left foot status post intervention by myself 01/18/2019 with stenting of distal left SFA CTO using an Eluvia  6 mm x 120 mm long drug-eluting stent as well as intervention on her proximal left SFA.  She had 0 vessel runoff but a large geniculate collateral that arose from the popliteal artery to the distal anterior tibial.  Her symptoms markedly improved.  She has a small crusted over wound on the dorsal aspect of her left second toe but the pain and rubor have significantly improved.  Her follow-up Doppler studies showed patent SFA stent with improved ABIs.  She is on aspirin and Plavix.  We will repeat lower extremity arterial Doppler studies in 6 months  Hyperlipidemia History of hyperlipidemia on  statin therapy.  We will recheck a lipid liver profile      Lorretta Harp MD [facility], [facility] 05/02/2019 12:05 PM

## 2019-05-16 LAB — HEPATIC FUNCTION PANEL
ALT: 16 IU/L (ref 0–32)
AST: 17 IU/L (ref 0–40)
Albumin: 4.6 g/dL (ref 3.7–4.7)
Alkaline Phosphatase: 120 IU/L — ABNORMAL HIGH (ref 39–117)
Bilirubin Total: 1.2 mg/dL (ref 0.0–1.2)
Bilirubin, Direct: 0.23 mg/dL (ref 0.00–0.40)
Total Protein: 7.1 g/dL (ref 6.0–8.5)

## 2019-05-16 LAB — BASIC METABOLIC PANEL
BUN/Creatinine Ratio: 10 — ABNORMAL LOW (ref 12–28)
BUN: 8 mg/dL (ref 8–27)
CO2: 23 mmol/L (ref 20–29)
Calcium: 10 mg/dL (ref 8.7–10.3)
Chloride: 104 mmol/L (ref 96–106)
Creatinine, Ser: 0.79 mg/dL (ref 0.57–1.00)
GFR calc Af Amer: 83 mL/min/{1.73_m2} (ref >59)
GFR calc non Af Amer: 72 mL/min/{1.73_m2} (ref >59)
Glucose: 121 mg/dL — ABNORMAL HIGH (ref 65–99)
Potassium: 4.5 mmol/L (ref 3.5–5.2)
Sodium: 144 mmol/L (ref 134–144)

## 2019-05-16 LAB — LIPID PANEL
Chol/HDL Ratio: 2.7 ratio (ref 0.0–4.4)
Cholesterol, Total: 156 mg/dL (ref 100–199)
HDL: 57 mg/dL (ref >39)
LDL Chol Calc (NIH): 80 mg/dL (ref 0–99)
Triglycerides: 107 mg/dL (ref 0–149)
VLDL Cholesterol Cal: 19 mg/dL (ref 5–40)

## 2019-11-01 ENCOUNTER — Other Ambulatory Visit: Payer: Self-pay | Admitting: *Deleted

## 2019-11-01 MED ORDER — ATORVASTATIN CALCIUM 80 MG PO TABS: 80.0000 mg | ORAL_TABLET | Freq: Every day | ORAL | 2 refills | Status: DC

## 2019-11-06 ENCOUNTER — Other Ambulatory Visit (HOSPITAL_COMMUNITY): Payer: Self-pay | Admitting: Cardiovascular Disease

## 2019-11-06 ENCOUNTER — Encounter (HOSPITAL_COMMUNITY): Payer: Medicare Other

## 2019-11-06 ENCOUNTER — Other Ambulatory Visit: Payer: Self-pay

## 2019-11-06 ENCOUNTER — Ambulatory Visit (HOSPITAL_COMMUNITY)
Admission: RE | Admit: 2019-11-06 | Discharge: 2019-11-06 | Disposition: A | Discharge status: Home / Self Care | Payer: Medicare Other | Source: Ambulatory Visit | Attending: Cardiology | Admitting: Cardiology

## 2019-11-06 DIAGNOSIS — Z959 Presence of cardiac and vascular implant and graft, unspecified: Secondary | ICD-10-CM

## 2019-11-06 DIAGNOSIS — I998 Other disorder of circulatory system: Secondary | ICD-10-CM | POA: Diagnosis present

## 2019-11-06 DIAGNOSIS — I739 Peripheral vascular disease, unspecified: Secondary | ICD-10-CM

## 2019-11-06 DIAGNOSIS — Z9889 Other specified postprocedural states: Secondary | ICD-10-CM

## 2019-11-08 ENCOUNTER — Encounter: Payer: Self-pay | Admitting: Cardiovascular Disease

## 2019-11-08 ENCOUNTER — Telehealth (INDEPENDENT_AMBULATORY_CARE_PROVIDER_SITE_OTHER): Payer: Medicare Other | Admitting: Cardiovascular Disease

## 2019-11-08 VITALS — Ht 66.0 in | Wt 125.0 lb

## 2019-11-08 DIAGNOSIS — Z95820 Peripheral vascular angioplasty status with implants and grafts: Secondary | ICD-10-CM | POA: Diagnosis not present

## 2019-11-08 DIAGNOSIS — Z9889 Other specified postprocedural states: Secondary | ICD-10-CM

## 2019-11-08 DIAGNOSIS — I739 Peripheral vascular disease, unspecified: Secondary | ICD-10-CM

## 2019-11-08 DIAGNOSIS — I998 Other disorder of circulatory system: Secondary | ICD-10-CM

## 2019-11-08 DIAGNOSIS — I70229 Atherosclerosis of native arteries of extremities with rest pain, unspecified extremity: Secondary | ICD-10-CM

## 2019-11-08 NOTE — Patient Instructions (Signed)
Medication Instructions:  Continue current medications  *If you need a refill on your cardiac medications before your next appointment, please call your pharmacy*   Lab Work: None Ordered   Testing/Procedures: Your physician has requested that you have a lower extremity arterial duplex. This test is an ultrasound of the arteries in the legs or arms. It looks at arterial blood flow in the legs and arms. Allow one hour for Lower and Upper Arterial scans. There are no restrictions or special instructions    Follow-Up: At [facility], you and your health needs are our priority.  As part of our continuing mission to provide you with exceptional heart care, we have created designated Provider Care Teams.  These Care Teams include your primary Cardiologist (physician) and Advanced Practice Providers (APPs -  Physician Assistants and Nurse Practitioners) who all work together to provide you with the care you need, when you need it.  We recommend signing up for the patient portal called "MyChart".  Sign up information is provided on this After Visit Summary.  MyChart is used to connect with patients for Virtual Visits (Telemedicine).  Patients are able to view lab/test results, encounter notes, upcoming appointments, etc.  Non-urgent messages can be sent to your provider as well.   To learn more about what you can do with MyChart, go to [email or web address].    Your next appointment:   1 year(s)  The format for your next appointment:   In Person  Provider:   You may see Jonathan Berry, MD or one of the following Advanced Practice Providers on your designated Care Team:    Luke Kilroy, PA-C  Callie Goodrich, PA-C  Jesse Cleaver, FNP    

## 2019-11-08 NOTE — Progress Notes (Signed)
Virtual Visit via Telephone Note   This visit type was conducted due to national recommendations for restrictions regarding the COVID-19 Pandemic (e.g. social distancing) in an effort to limit this patient's exposure and mitigate transmission in our community.  Due to her co-morbid illnesses, this patient is at least at moderate risk for complications without adequate follow up.  This format is felt to be most appropriate for this patient at this time.  The patient did not have access to video technology/had technical difficulties with video requiring transitioning to audio format only (telephone).  All issues noted in this document were discussed and addressed.  No physical exam could be performed with this format.  Please refer to the patient's chart for her  consent to telehealth for [facility].   The patient was identified using 2 identifiers.  Date:  11/08/2019   ID:  [name], DOB [birth year], MRN [id number]  Patient Location: Home Provider Location: Home  PCP:  Patient, No Pcp Per  Cardiologist:  Rollene Rotunda, MD  Peripheral vascular cardiologist: Dr. [name] Electrophysiologist:  None   Evaluation Performed:  Follow-Up Visit  Chief Complaint: Peripheral arterial disease  History of Present Illness:    Cynthie Garmon is a 79 y.o. thin appearing widowed African-American female mother of 3, grandmother of 6 grandchildren referred to me by Dr. Antoine Poche for peripheral vascular valuation because of critical limb ischemia and rest pain. She has no cardiac risk factors.I last saw her in the office  05/02/2019.She was referred by Dr. Ruthine Dose Dr. Antoine Poche because of this. She has had dependent rubor and pain in her left foot for several weeks. There are no open wounds. She cannot walk with this. Dopplers performed 01/14/2019 revealed high-grade mid left SFA stenosis with 0 vessel runoff. Her right ABI was 0.85 and left was 0.38.  I performed peripheral angiography  and intervention on her 01/15/2019 stenting of a distal left SFA CTO with a drug-eluting stent perform angioplasty on a high-grade segmental proximal left SFA. She had 0 vessel runoff below the knee with a large collateral vessel originating from the popliteal artery to the dorsalis pedis. She says that her foot feels better. She did have some edema in her foot probably related to increased blood flow is related to the intervention. She does have a small blister on her left great toe. Her foot is warm and she had barely palpable pedal pulses.    Her follow-up Dopplers performed 02/05/2019 revealed a patent left SFA stent with improvement in her left ABI.    Since I saw her back 6 months ago she is done well.  She denies claudication.  There are no open wounds.  Her Dopplers performed 11/06/2019 revealed a left ABI of 0.92 with a widely patent stent.  The patient does not have symptoms concerning for COVID-19 infection (fever, chills, cough, or new shortness of breath).    No past medical history on file. Past Surgical History:  Procedure Laterality Date  . ABDOMINAL AORTOGRAM W/LOWER EXTREMITY Bilateral 01/18/2019   Procedure: ABDOMINAL AORTOGRAM W/LOWER EXTREMITY;  Surgeon: Runell Gess, MD;  Location: [facility] INVASIVE CV LAB;  Service: Cardiovascular;  Laterality: Bilateral;  . ABDOMINAL HYSTERECTOMY    . PERIPHERAL VASCULAR BALLOON ANGIOPLASTY Left 01/18/2019   Procedure: PERIPHERAL VASCULAR BALLOON ANGIOPLASTY;  Surgeon: Runell Gess, MD;  Location: [facility] INVASIVE CV LAB;  Service: Cardiovascular;  Laterality: Left;  SFA  . PERIPHERAL VASCULAR INTERVENTION Left 01/18/2019   Procedure: PERIPHERAL VASCULAR INTERVENTION;  Surgeon: Allyson Sabal,  Pearletha Forge, MD;  Location: [facility] CV LAB;  Service: Cardiovascular;  Laterality: Left;  Popliteal stent     No outpatient medications have been marked as taking for the 11/08/19 encounter (Appointment) with Lorretta Harp, MD.     Allergies:   Patient  has no known allergies.   Social History   Tobacco Use  . Smoking status: Never Smoker  . Smokeless tobacco: Never Used  Substance Use Topics  . Alcohol use: Never  . Drug use: Not on file     Family Hx: The patient's family history is not on file.  ROS:   Please see the history of present illness.     All other systems reviewed and are negative.   Prior CV studies:   The following studies were reviewed today:  Peripheral vascular Doppler studies performed 11/06/2019  Labs/Other Tests and Data Reviewed:    EKG:  No ECG reviewed.  Recent Labs: 01/19/2019: Hemoglobin 10.1; Platelets 374 05/16/2019: ALT 16; BUN 8; Creatinine, Ser 0.79; Potassium 4.5; Sodium 144   Recent Lipid Panel Lab Results  Component Value Date/Time   CHOL 156 05/16/2019 09:37 AM   TRIG 107 05/16/2019 09:37 AM   HDL 57 05/16/2019 09:37 AM   CHOLHDL 2.7 05/16/2019 09:37 AM   LDLCALC 80 05/16/2019 09:37 AM    Wt Readings from Last 3 Encounters:  05/02/19 125 lb 6.4 oz (56.9 kg)  02/02/19 129 lb (58.5 kg)  01/19/19 137 lb 9.1 oz (62.4 kg)     Objective:    Vital Signs:  There were no vitals taken for this visit.   VITAL SIGNS:  reviewed a complete physical exam was not performed today since this was a virtual telemedicine phone visit  ASSESSMENT & PLAN:    1. Peripheral arterial disease-status post left distal SFA PTA and drug-eluting stenting using a 6 mm x 120 mm long Eluvia  drug-eluting stent of a short CTO.  She did have intervention of an 80% lesion in the proximal left SFA as well.  She has 0 vessel runoff but did have a large geniculate collateral.  Since her intervention she is denied claudication.  Her follow-up Dopplers performed yesterday revealed a left ABI of 0.92 with a widely patent stent.  COVID-19 Education: The signs and symptoms of COVID-19 were discussed with the patient and how to seek care for testing (follow up with PCP or arrange E-visit).  The importance of social  distancing was discussed today.  Time:   Today, I have spent 3 minutes with the patient with telehealth technology discussing the above problems.     Medication Adjustments/Labs and Tests Ordered: Current medicines are reviewed at length with the patient today.  Concerns regarding medicines are outlined above.   Tests Ordered: No orders of the defined types were placed in this encounter.   Medication Changes: No orders of the defined types were placed in this encounter.   Follow Up:  In Person in 1 year(s)  Signed, Quay Burow, MD  11/08/2019 11:15 AM    [place]

## 2019-11-09 ENCOUNTER — Other Ambulatory Visit: Payer: Self-pay | Admitting: *Deleted

## 2019-11-09 DIAGNOSIS — I998 Other disorder of circulatory system: Secondary | ICD-10-CM

## 2019-11-09 DIAGNOSIS — Z9889 Other specified postprocedural states: Secondary | ICD-10-CM

## 2019-11-09 DIAGNOSIS — I70229 Atherosclerosis of native arteries of extremities with rest pain, unspecified extremity: Secondary | ICD-10-CM

## 2019-11-09 DIAGNOSIS — I739 Peripheral vascular disease, unspecified: Secondary | ICD-10-CM

## 2020-01-25 ENCOUNTER — Telehealth: Payer: Self-pay | Admitting: Cardiovascular Disease

## 2020-01-25 ENCOUNTER — Other Ambulatory Visit: Payer: Self-pay | Admitting: Cardiovascular Disease

## 2020-01-25 MED ORDER — CLOPIDOGREL BISULFATE 75 MG PO TABS: 75.0000 mg | ORAL_TABLET | Freq: Every day | ORAL | 3 refills | Status: DC

## 2020-01-25 NOTE — Telephone Encounter (Signed)
New Message   Pts daughter is calling to follow up on medication refill

## 2020-01-25 NOTE — Telephone Encounter (Signed)
  *  STAT* If patient is at the pharmacy, call can be transferred to refill team.   1. Which medications need to be refilled? (please list name of each medication and dose if known)   clopidogrel (PLAVIX) 75 MG tablet    2. Which pharmacy/location (including street and city if local pharmacy) is medication to be sent to? [facility] [phone] - [place], [place] - [address]  3. Do they need a 30 day or 90 day supply? 90 days  Pt was out of this madication since tuesday

## 2020-04-22 ENCOUNTER — Other Ambulatory Visit: Payer: Self-pay | Admitting: Cardiovascular Disease

## 2020-04-30 ENCOUNTER — Other Ambulatory Visit: Payer: Self-pay | Admitting: Cardiovascular Disease

## 2020-08-08 ENCOUNTER — Other Ambulatory Visit: Payer: Self-pay | Admitting: Cardiovascular Disease

## 2020-11-07 ENCOUNTER — Other Ambulatory Visit: Payer: Self-pay

## 2020-11-07 ENCOUNTER — Ambulatory Visit: Payer: Medicare Other | Admitting: Cardiovascular Disease

## 2020-11-07 ENCOUNTER — Encounter: Payer: Self-pay | Admitting: Cardiovascular Disease

## 2020-11-07 ENCOUNTER — Ambulatory Visit (HOSPITAL_COMMUNITY)
Admission: RE | Admit: 2020-11-07 | Discharge: 2020-11-07 | Disposition: A | Discharge status: Home / Self Care | Payer: Medicare Other | Source: Ambulatory Visit | Attending: Cardiology | Admitting: Cardiology

## 2020-11-07 DIAGNOSIS — Z9889 Other specified postprocedural states: Secondary | ICD-10-CM

## 2020-11-07 DIAGNOSIS — E782 Mixed hyperlipidemia: Secondary | ICD-10-CM

## 2020-11-07 DIAGNOSIS — I70229 Atherosclerosis of native arteries of extremities with rest pain, unspecified extremity: Secondary | ICD-10-CM

## 2020-11-07 DIAGNOSIS — I739 Peripheral vascular disease, unspecified: Secondary | ICD-10-CM

## 2020-11-07 MED ORDER — ATORVASTATIN CALCIUM 80 MG PO TABS: ORAL_TABLET | ORAL | 2 refills | Status: DC

## 2020-11-07 MED ORDER — CLOPIDOGREL BISULFATE 75 MG PO TABS: 75.0000 mg | ORAL_TABLET | Freq: Every day | ORAL | 3 refills | Status: DC

## 2020-11-07 MED ORDER — LOSARTAN POTASSIUM 25 MG PO TABS: ORAL_TABLET | ORAL | 3 refills | Status: DC

## 2020-11-07 NOTE — Progress Notes (Signed)
11/07/2020 [name]   1941-04-22  [id number]  Primary Physician Patient, No Pcp Per Primary Cardiologist: Runell Gess MD Milagros Loll, [place], [place]  HPI:  Gina Carter is a 80 y.o.  thin appearing widowed African-American female mother of 3, grandmother of 6 grandchildren referred to me by Dr. Antoine Poche for peripheral vascular valuation because of critical limb ischemia and rest pain. She has no cardiac risk factors.I last saw her in the office  05/02/2019.She was referred by Dr. Ruthine Dose Dr. Antoine Poche because of this. She has had dependent rubor and pain in her left foot for several weeks. There are no open wounds. She cannot walk with this. Dopplers performed 01/14/2019 revealed high-grade mid left SFA stenosis with 0 vessel runoff. Her right ABI was 0.85 and left was 0.38.  I performed peripheral angiography and intervention on her 01/15/2019 stenting of a distal left SFA CTO with a drug-eluting stent perform angioplasty on a high-grade segmental proximal left SFA. She had 0 vessel runoff below the knee with a large collateral vessel originating from the popliteal artery to the dorsalis pedis. She says that her foot feels better. She did have some edema in her foot probably related to increased blood flow is related to the intervention. She does have a small blister on her left great toe. Her foot is warm and she had barely palpable pedal pulses.  Since I saw her 6 months ago she continues to do well.  She denies chest pain, shortness of breath or claudication.  There is no evidence of recurrent critical ischemia.  Her lower extremity arterial Doppler studies performed today did reveal a decline in her left ABI to 0.47 with an occluded left popliteal artery stent which she is asymptomatic from.    Current Meds  Medication Sig  . aspirin EC 81 MG tablet Take 81 mg by mouth daily.   Marland Kitchen atorvastatin (LIPITOR) 80 MG tablet TAKE 1 TABLET(80 MG) BY MOUTH DAILY AT 6 PM  .  clopidogrel (PLAVIX) 75 MG tablet Take 1 tablet (75 mg total) by mouth daily with breakfast.  . diphenhydrAMINE (BENADRYL) 2 % cream Apply 1 application topically 3 (three) times daily as needed for itching.  . losartan (COZAAR) 25 MG tablet TAKE 1 TABLET(25 MG) BY MOUTH DAILY     No Known Allergies  Social History   Socioeconomic History  . Marital status: Widowed    Spouse name: Not on file  . Number of children: Not on file  . Years of education: Not on file  . Highest education level: Not on file  Occupational History  . Not on file  Tobacco Use  . Smoking status: Never Smoker  . Smokeless tobacco: Never Used  Substance and Sexual Activity  . Alcohol use: Never  . Drug use: Not on file  . Sexual activity: Not on file  Other Topics Concern  . Not on file  Social History Narrative   Lives alone.  Three children.     Social Determinants of Health   Financial Resource Strain: Not on file  Food Insecurity: Not on file  Transportation Needs: Not on file  Physical Activity: Not on file  Stress: Not on file  Social Connections: Not on file  Intimate Partner Violence: Not on file     Review of Systems: General: negative for chills, fever, night sweats or weight changes.  Cardiovascular: negative for chest pain, dyspnea on exertion, edema, orthopnea, palpitations, paroxysmal nocturnal dyspnea or shortness of breath Dermatological: negative  for rash Respiratory: negative for cough or wheezing Urologic: negative for hematuria Abdominal: negative for nausea, vomiting, diarrhea, bright red blood per rectum, melena, or hematemesis Neurologic: negative for visual changes, syncope, or dizziness All other systems reviewed and are otherwise negative except as noted above.    Blood pressure (!) 158/84, pulse 63, height 5\' 5"  (1.651 m), weight 125 lb 6.4 oz (56.9 kg), SpO2 99 %.  General appearance: alert and no distress Neck: no adenopathy, no carotid bruit, no JVD, supple,  symmetrical, trachea midline and thyroid not enlarged, symmetric, no tenderness/mass/nodules Lungs: clear to auscultation bilaterally Heart: regular rate and rhythm, S1, S2 normal, no murmur, click, rub or gallop Extremities: extremities normal, atraumatic, no cyanosis or edema Pulses: Diminished pedal pulses Skin: Skin color, texture, turgor normal. No rashes or lesions Neurologic: Alert and oriented X 3, normal strength and tone. Normal symmetric reflexes. Normal coordination and gait  EKG sinus rhythm at 63 with lateral T wave inversion.  I personally reviewed this EKG.  ASSESSMENT AND PLAN:   Critical limb ischemia with history of revascularization of same extremity History of critical limb ischemia with ulcer on her left foot status post endovascular therapy 01/10/2019 with stenting of an occluded left above-the-knee popliteal artery and PTA of the left SFA.  There was a large collateral supplying a tibial vessel beyond the stented segment.  Her ulcer healed and her pain resolved.  Unfortunately, Doppler performed today revealed a decline in her left ABI to 0.47 with occluded left popliteal stent.  The patient denies recurrent critical limb ischemia or claudication.  At this point, there is no indication for intervention.  Hyperlipidemia History of hyperlipidemia on statin therapy with lipid profile performed 05/16/2019 revealing total cholesterol 156, LDL of 80 and HDL of 57.      05/18/2019 MD [facility], [facility] 11/07/2020 3:14 PM

## 2020-11-07 NOTE — Assessment & Plan Note (Signed)
History of critical limb ischemia with ulcer on her left foot status post endovascular therapy 01/10/2019 with stenting of an occluded left above-the-knee popliteal artery and PTA of the left SFA.  There was a large collateral supplying a tibial vessel beyond the stented segment.  Her ulcer healed and her pain resolved.  Unfortunately, Doppler performed today revealed a decline in her left ABI to 0.47 with occluded left popliteal stent.  The patient denies recurrent critical limb ischemia or claudication.  At this point, there is no indication for intervention.

## 2020-11-07 NOTE — Patient Instructions (Signed)

## 2020-11-07 NOTE — Assessment & Plan Note (Signed)
History of hyperlipidemia on statin therapy with lipid profile performed 05/16/2019 revealing total cholesterol 156, LDL of 80 and HDL of 57.

## 2020-11-07 NOTE — Addendum Note (Signed)
Addended by: Bernita Buffy on: 11/07/2020 03:21 PM   Modules accepted: Orders

## 2020-11-19 ENCOUNTER — Other Ambulatory Visit (HOSPITAL_COMMUNITY): Payer: Self-pay | Admitting: Cardiovascular Disease

## 2020-11-19 DIAGNOSIS — I739 Peripheral vascular disease, unspecified: Secondary | ICD-10-CM

## 2021-01-29 ENCOUNTER — Other Ambulatory Visit: Payer: Self-pay | Admitting: Cardiovascular Disease

## 2021-04-29 ENCOUNTER — Other Ambulatory Visit: Payer: Self-pay | Admitting: Cardiovascular Disease

## 2021-05-14 ENCOUNTER — Other Ambulatory Visit: Payer: Self-pay | Admitting: Cardiovascular Disease

## 2021-10-26 ENCOUNTER — Other Ambulatory Visit: Payer: Self-pay | Admitting: Cardiovascular Disease

## 2021-12-22 ENCOUNTER — Ambulatory Visit: Payer: Medicare Other | Admitting: Cardiovascular Disease

## 2021-12-24 ENCOUNTER — Other Ambulatory Visit (HOSPITAL_COMMUNITY): Payer: Self-pay | Admitting: Cardiovascular Disease

## 2021-12-24 DIAGNOSIS — I739 Peripheral vascular disease, unspecified: Secondary | ICD-10-CM

## 2021-12-29 ENCOUNTER — Ambulatory Visit (HOSPITAL_COMMUNITY)
Admission: RE | Admit: 2021-12-29 | Discharge: 2021-12-29 | Disposition: A | Discharge status: Home / Self Care | Payer: Medicare Other | Source: Ambulatory Visit | Attending: Cardiology | Admitting: Cardiology

## 2021-12-29 ENCOUNTER — Encounter: Payer: Self-pay | Admitting: Cardiovascular Disease

## 2021-12-29 ENCOUNTER — Ambulatory Visit: Payer: Medicare Other | Admitting: Cardiovascular Disease

## 2021-12-29 DIAGNOSIS — Z9889 Other specified postprocedural states: Secondary | ICD-10-CM | POA: Diagnosis not present

## 2021-12-29 DIAGNOSIS — E782 Mixed hyperlipidemia: Secondary | ICD-10-CM

## 2021-12-29 DIAGNOSIS — I70229 Atherosclerosis of native arteries of extremities with rest pain, unspecified extremity: Secondary | ICD-10-CM

## 2021-12-29 DIAGNOSIS — I739 Peripheral vascular disease, unspecified: Secondary | ICD-10-CM | POA: Diagnosis present

## 2021-12-29 NOTE — Patient Instructions (Signed)

## 2021-12-29 NOTE — Assessment & Plan Note (Addendum)
History of hyperlipidemia on statin therapy followed by her PCP.  Her most recent lipid liver profile performed 10/23/2021 revealed total cholesterol 168, LDL of 86 and HDL 65. ?

## 2021-12-29 NOTE — Assessment & Plan Note (Signed)
History of critical limb ischemia of her left leg status post angiography and intervention by myself 04/12/2019 with stenting of a distal left SFA CTO using a 6 mm Eluvia self-expanding stent and intervention on a segmental proximal lesion with 0 vessel runoff.  She did have a large popliteal collateral with that filled her dorsalis pedis.  She has 0 vessel runoff on the left as well.  She currently denies claudication. ?

## 2021-12-29 NOTE — Progress Notes (Signed)
? ? ? ?12/29/2021 ?Gina Carter   ?10/08/1940  ?[id number] ? ?Primary Physician Gina Boozer, MD ?Primary Cardiologist: Gina Gess MD Gina Carter, [place] ? ?HPI:  Gina Carter is a 81 y.o.  thin appearing widowed African-American female mother of 3, grandmother of 6 grandchildren referred to me by Dr. Antoine Carter for peripheral vascular valuation because of critical limb ischemia and rest pain.  She is accompanied by her daughter Gina Carter today.  She has no cardiac risk factors.  I last saw her in the office 11/07/2020. She was referred by Dr. Harriet Carter to Dr. Antoine Carter because of this.  She has had dependent rubor and pain in her left foot for several weeks.  There are no open wounds.  She cannot walk with this.  Dopplers performed 01/14/2019 revealed high-grade mid left SFA stenosis with 0 vessel runoff.  Her right ABI was 0.85 and left was 0.38. ?  ?I performed peripheral angiography and intervention on her 01/15/2019 stenting of a distal left SFA CTO with a drug-eluting stent perform angioplasty on a high-grade segmental proximal left SFA.  She had 0 vessel runoff below the knee with a large collateral vessel originating from the popliteal artery to the dorsalis pedis.  She says that her foot feels better.  She did have some edema in her foot probably related to increased blood flow is related to the intervention.  She does have a small blister on her left great toe.  Her foot is warm and she had barely palpable pedal pulses. ?  ?Since I saw her 6 months ago she continues to do well.  She denies chest pain, shortness of breath or claudication.  There is no evidence of recurrent critical ischemia.  Her lower extremity arterial Doppler studies performed a year ago showed a widely patent proximal left SFA with an occluded left SFA stent. ? ?Current Meds  ?Medication Sig  ? aspirin EC 81 MG tablet Take 81 mg by mouth daily.   ? atorvastatin (LIPITOR) 80 MG tablet TAKE 1 TABLET(80 MG) BY MOUTH DAILY AT 6 PM  ?  clopidogrel (PLAVIX) 75 MG tablet TAKE 1 TABLET(75 MG) BY MOUTH DAILY WITH BREAKFAST  ? diphenhydrAMINE (BENADRYL) 2 % cream Apply 1 application topically 3 (three) times daily as needed for itching.  ? losartan (COZAAR) 25 MG tablet TAKE 1 TABLET(25 MG) BY MOUTH DAILY  ?  ? ?No Known Allergies ? ?Social History  ? ?Socioeconomic History  ? Marital status: Widowed  ?  Spouse name: Not on file  ? Number of children: Not on file  ? Years of education: Not on file  ? Highest education level: Not on file  ?Occupational History  ? Not on file  ?Tobacco Use  ? Smoking status: Never  ? Smokeless tobacco: Never  ?Substance and Sexual Activity  ? Alcohol use: Never  ? Drug use: Not on file  ? Sexual activity: Not on file  ?Other Topics Concern  ? Not on file  ?Social History Narrative  ? Lives alone.  Three children.    ? ?Social Determinants of Health  ? ?Financial Resource Strain: Not on file  ?Food Insecurity: Not on file  ?Transportation Needs: Not on file  ?Physical Activity: Not on file  ?Stress: Not on file  ?Social Connections: Not on file  ?Intimate Partner Violence: Not on file  ?  ? ?Review of Systems: ?General: negative for chills, fever, night sweats or weight changes.  ?Cardiovascular: negative for chest pain, dyspnea on exertion,  edema, orthopnea, palpitations, paroxysmal nocturnal dyspnea or shortness of breath ?Dermatological: negative for rash ?Respiratory: negative for cough or wheezing ?Urologic: negative for hematuria ?Abdominal: negative for nausea, vomiting, diarrhea, bright red blood per rectum, melena, or hematemesis ?Neurologic: negative for visual changes, syncope, or dizziness ?All other systems reviewed and are otherwise negative except as noted above. ? ? ? ?Blood pressure 140/70, pulse 67, height 5\' 5"  (1.651 m), weight 122 lb (55.3 kg).  ?General appearance: alert and no distress ?Neck: no adenopathy, no carotid bruit, no JVD, supple, symmetrical, trachea midline, and thyroid not enlarged,  symmetric, no tenderness/mass/nodules ?Lungs: clear to auscultation bilaterally ?Heart: regular rate and rhythm, S1, S2 normal, no murmur, click, rub or gallop ?Extremities: extremities normal, atraumatic, no cyanosis or edema ?Pulses: Absent pedal pulses ?Skin: Skin color, texture, turgor normal. No rashes or lesions ?Neurologic: Grossly normal ? ?EKG sinus rhythm at 67 with nonspecific ST and T wave changes and borderline voltage evidence for LVH.  I personally reviewed this EKG. ? ?ASSESSMENT AND PLAN:  ? ?Critical limb ischemia with history of revascularization of same extremity ?History of critical limb ischemia of her left leg status post angiography and intervention by myself 04/12/2019 with stenting of a distal left SFA CTO using a 6 mm Eluvia self-expanding stent and intervention on a segmental proximal lesion with 0 vessel runoff.  She did have a large popliteal collateral with that filled her dorsalis pedis.  She has 0 vessel runoff on the left as well.  She currently denies claudication. ? ?Hyperlipidemia ?History of hyperlipidemia on statin therapy followed by her PCP.  Her most recent lipid liver profile performed 10/23/2021 revealed total cholesterol 168, LDL of 86 and HDL 65. ? ? ? ? ?12/23/2021 MD FACP,FACC,FAHA, FSCAI ?12/29/2021 ?12:29 PM ?

## 2022-02-08 ENCOUNTER — Other Ambulatory Visit (HOSPITAL_COMMUNITY): Payer: Self-pay | Admitting: Cardiovascular Disease

## 2022-02-08 DIAGNOSIS — Z9862 Peripheral vascular angioplasty status: Secondary | ICD-10-CM

## 2022-03-01 ENCOUNTER — Telehealth: Payer: Self-pay | Admitting: Cardiovascular Disease

## 2022-03-01 MED ORDER — ATORVASTATIN CALCIUM 80 MG PO TABS: ORAL_TABLET | ORAL | 1 refills | Status: DC

## 2022-03-01 NOTE — Telephone Encounter (Signed)
 *  STAT* If patient is at the pharmacy, call can be transferred to refill team.   1. Which medications need to be refilled? (please list name of each medication and dose if known) atorvastatin (LIPITOR) 80 MG tablet clopidogrel (PLAVIX) 75 MG tablet  2. Which pharmacy/location (including street and city if local pharmacy) is medication to be sent to? [facility] [phone] - [place], [place] - [address]  3. Do they need a 30 day or 90 day supply? 90 days  Pt's daughter said, pt is out of meds and needs refill today

## 2022-03-02 ENCOUNTER — Telehealth: Payer: Self-pay | Admitting: Cardiovascular Disease

## 2022-03-02 NOTE — Telephone Encounter (Signed)
*  STAT* If patient is at the pharmacy, call can be transferred to refill team.   1. Which medications need to be refilled? (please list name of each medication and dose if known) Clopidogrel  2. Which pharmacy/location (including street and city if local pharmacy) is medication to be sent to? [facility] [place], [place]  3. Do they need a 30 day or 90 day supply? 90 days and refills- Please call today- she is completely out of it

## 2022-03-03 ENCOUNTER — Telehealth: Payer: Self-pay | Admitting: Cardiovascular Disease

## 2022-03-03 MED ORDER — CLOPIDOGREL BISULFATE 75 MG PO TABS: 75.0000 mg | ORAL_TABLET | Freq: Every day | ORAL | 3 refills | Status: DC

## 2022-03-03 NOTE — Telephone Encounter (Signed)
Daughter called to [employer]. Patient is out of meds. This is the 3rd refill request over 3 days for plavix. Rx(s) sent to pharmacy electronically.

## 2022-03-03 NOTE — Telephone Encounter (Signed)
*  STAT* If patient is at the pharmacy, call can be transferred to refill team.   1. Which medications need to be refilled? (please list name of each medication and dose if known)   clopidogrel (PLAVIX) 75 MG tablet    2. Which pharmacy/location (including street and city if local pharmacy) is medication to be sent to? [facility] [phone] - [place], [place] - [address].  Do they need a 30 day or 90 day supply? 90 day   Pt is completely ou t of medication, 3rd time calling.

## 2022-07-23 ENCOUNTER — Other Ambulatory Visit: Payer: Self-pay | Admitting: Cardiovascular Disease

## 2022-10-21 ENCOUNTER — Other Ambulatory Visit: Payer: Self-pay | Admitting: Cardiovascular Disease

## 2022-10-25 ENCOUNTER — Other Ambulatory Visit: Payer: Self-pay | Admitting: Cardiovascular Disease

## 2023-01-19 ENCOUNTER — Other Ambulatory Visit: Payer: Self-pay | Admitting: Cardiovascular Disease

## 2023-02-11 ENCOUNTER — Other Ambulatory Visit (HOSPITAL_COMMUNITY): Payer: Self-pay | Admitting: Cardiovascular Disease

## 2023-02-11 DIAGNOSIS — Z9862 Peripheral vascular angioplasty status: Secondary | ICD-10-CM

## 2023-02-11 DIAGNOSIS — I739 Peripheral vascular disease, unspecified: Secondary | ICD-10-CM

## 2023-02-16 ENCOUNTER — Ambulatory Visit (HOSPITAL_COMMUNITY)
Admission: RE | Admit: 2023-02-16 | Discharge: 2023-02-16 | Disposition: A | Discharge status: Home / Self Care | Payer: Medicare HMO | Source: Ambulatory Visit | Attending: Cardiology | Admitting: Cardiology

## 2023-02-16 DIAGNOSIS — I739 Peripheral vascular disease, unspecified: Secondary | ICD-10-CM | POA: Diagnosis present

## 2023-02-16 DIAGNOSIS — Z9862 Peripheral vascular angioplasty status: Secondary | ICD-10-CM

## 2023-02-16 LAB — VAS US ABI WITH/WO TBI: Right ABI: 0.94

## 2023-02-17 LAB — VAS US ABI WITH/WO TBI: Left ABI: 0.75

## 2023-02-23 ENCOUNTER — Encounter: Payer: Self-pay | Admitting: Cardiovascular Disease

## 2023-02-23 ENCOUNTER — Ambulatory Visit: Payer: Medicare HMO | Attending: Cardiovascular Disease | Admitting: Cardiovascular Disease

## 2023-02-23 VITALS — BP 148/78 | HR 62 | Ht 65.0 in | Wt 126.2 lb

## 2023-02-23 DIAGNOSIS — I739 Peripheral vascular disease, unspecified: Secondary | ICD-10-CM

## 2023-02-23 DIAGNOSIS — I70229 Atherosclerosis of native arteries of extremities with rest pain, unspecified extremity: Secondary | ICD-10-CM

## 2023-02-23 NOTE — Progress Notes (Signed)
02/23/2023 Gina Carter   [birth year]  [id number]  Primary Physician Nada Boozer, MD Primary Cardiologist: Runell Gess MD Milagros Loll, [place], [place]  HPI:  Gina Carter is a 82 y.o.   thin appearing widowed African-American female mother of 3, grandmother of 6 grandchildren referred to me by Dr. Antoine Poche for peripheral vascular valuation because of critical limb ischemia and rest pain.  She is accompanied by her son Darryl today.  She has no cardiac risk factors.  I last saw her in the office 11/07/2020. She was referred by Dr. Harriet Pho to Dr. Antoine Poche because of this.  She has had dependent rubor and pain in her left foot for several weeks.  There are no open wounds.  She cannot walk with this.  Dopplers performed 01/14/2019 revealed high-grade mid left SFA stenosis with 0 vessel runoff.  Her right ABI was 0.85 and left was 0.38.   I performed peripheral angiography and intervention on her 01/15/2019 stenting of a distal left SFA CTO with a drug-eluting stent perform angioplasty on a high-grade segmental proximal left SFA.  She had 0 vessel runoff below the knee with a large collateral vessel originating from the popliteal artery to the dorsalis pedis.  She says that her foot feels better.  She did have some edema in her foot probably related to increased blood flow is related to the intervention.  She does have a small blister on her left great toe.  Her foot is warm and she had barely palpable pedal pulses.   Since I saw her in the office a year ago she continues to do well.  She lives alone and is independent.  She still drives.  She walks without limitation.  She denies chest pain, shortness of breath or claudication.  She did have Doppler studies performed 02/16/2023 revealing a right ABI of 0.94 and a left of 0.75.  Left SFA.  Patent although her left popliteal stent appeared occluded.   Current Meds  Medication Sig   aspirin EC 81 MG tablet Take 81 mg by mouth daily.     atorvastatin (LIPITOR) 80 MG tablet TAKE 1 TABLET(80 MG) BY MOUTH DAILY AT 6 PM   clopidogrel (PLAVIX) 75 MG tablet TAKE 1 TABLET(75 MG) BY MOUTH DAILY   diphenhydrAMINE (BENADRYL) 2 % cream Apply 1 application topically 3 (three) times daily as needed for itching.   losartan (COZAAR) 25 MG tablet TAKE 1 TABLET(25 MG) BY MOUTH DAILY     No Known Allergies  Social History   Socioeconomic History   Marital status: Widowed    Spouse name: Not on file   Number of children: Not on file   Years of education: Not on file   Highest education level: Not on file  Occupational History   Not on file  Tobacco Use   Smoking status: Never   Smokeless tobacco: Never  Substance and Sexual Activity   Alcohol use: Never   Drug use: Not on file   Sexual activity: Not on file  Other Topics Concern   Not on file  Social History Narrative   Lives alone.  Three children.     Social Determinants of Health   Financial Resource Strain: Not on file  Food Insecurity: Not on file  Transportation Needs: Not on file  Physical Activity: Not on file  Stress: Not on file  Social Connections: Not on file  Intimate Partner Violence: Not on file     Review of Systems: General: negative  for chills, fever, night sweats or weight changes.  Cardiovascular: negative for chest pain, dyspnea on exertion, edema, orthopnea, palpitations, paroxysmal nocturnal dyspnea or shortness of breath Dermatological: negative for rash Respiratory: negative for cough or wheezing Urologic: negative for hematuria Abdominal: negative for nausea, vomiting, diarrhea, bright red blood per rectum, melena, or hematemesis Neurologic: negative for visual changes, syncope, or dizziness All other systems reviewed and are otherwise negative except as noted above.    Blood pressure (!) 148/78, pulse 62, height 5\' 5"  (1.651 m), weight 126 lb 3.2 oz (57.2 kg), SpO2 98 %.  General appearance: alert and no distress Neck: no adenopathy, no  carotid bruit, no JVD, supple, symmetrical, trachea midline, and thyroid not enlarged, symmetric, no tenderness/mass/nodules Lungs: clear to auscultation bilaterally Heart: regular rate and rhythm, S1, S2 normal, no murmur, click, rub or gallop Extremities: extremities normal, atraumatic, no cyanosis or edema Pulses: Decreased pedal pulses Skin: Skin color, texture, turgor normal. No rashes or lesions Neurologic: Grossly normal  EKG EKG Interpretation Date/Time:  Wednesday February 23 2023 14:19:29 EDT Ventricular Rate:  62 PR Interval:  138 QRS Duration:  80 QT Interval:  412 QTC Calculation: 418 R Axis:   39  Text Interpretation: Normal sinus rhythm Nonspecific ST and T wave abnormality When compared with ECG of 18-Jan-2019 10:53, Nonspecific T wave abnormality, worse in Lateral leads Confirmed by [name] [phone]) on 02/23/2023 2:37:20 PM    ASSESSMENT AND PLAN:   Critical lower limb ischemia (HCC) Ms. Rayford was initially sent to me by Dr. Antoine Poche because of critical limb ischemia she did have dependent rubor left foot but she had no open wounds.  Her initial Doppler showed a right ABI of 0.85 and a left of 0.38.  I performed angiography on her 01/15/2019 and stented her distal left SFA CTO with drug-eluting stent.  Performed angioplasty of high-grade segmental proximal SFA stenosis.  She has 0 vessel runoff with a large collateral that originated from the popliteal artery to the dorsalis pedis.  When I saw her back her foot felt better.  She currently denies claudication.  Her most recent Dopplers performed 02/16/2023 revealed a right ABI of 0.94 and a left of 0.75.  It appears that her left SFA is patent and the popliteal artery was occluded at the site of prior stenting.     Runell Gess MD FACP,FACC,FAHA, [facility] 02/23/2023 2:46 PM

## 2023-02-23 NOTE — Patient Instructions (Signed)
Medication Instructions:  Your physician recommends that you continue on your current medications as directed. Please refer to the Current Medication list given to you today.  *If you need a refill on your cardiac medications before your next appointment, please call your pharmacy*    Follow-Up: At [facility], you and your health needs are our priority.  As part of our continuing mission to provide you with exceptional heart care, we have created designated Provider Care Teams.  These Care Teams include your primary Cardiologist (physician) and Advanced Practice Providers (APPs -  Physician Assistants and Nurse Practitioners) who all work together to provide you with the care you need, when you need it.  We recommend signing up for the patient portal called "MyChart".  Sign up information is provided on this After Visit Summary.  MyChart is used to connect with patients for Virtual Visits (Telemedicine).  Patients are able to view lab/test results, encounter notes, upcoming appointments, etc.  Non-urgent messages can be sent to your provider as well.   To learn more about what you can do with MyChart, go to [email or web address].    Your next appointment:   We will see you on as needed basis.  Provider:   [name], MD

## 2023-02-23 NOTE — Assessment & Plan Note (Signed)
Ms. Greenan was initially sent to me by Dr. Antoine Poche because of critical limb ischemia she did have dependent rubor left foot but she had no open wounds.  Her initial Doppler showed a right ABI of 0.85 and a left of 0.38.  I performed angiography on her 01/15/2019 and stented her distal left SFA CTO with drug-eluting stent.  Performed angioplasty of high-grade segmental proximal SFA stenosis.  She has 0 vessel runoff with a large collateral that originated from the popliteal artery to the dorsalis pedis.  When I saw her back her foot felt better.  She currently denies claudication.  Her most recent Dopplers performed 02/16/2023 revealed a right ABI of 0.94 and a left of 0.75.  It appears that her left SFA is patent and the popliteal artery was occluded at the site of prior stenting.

## 2023-04-29 ENCOUNTER — Other Ambulatory Visit: Payer: Self-pay | Admitting: Cardiovascular Disease

## 2023-05-03 ENCOUNTER — Telehealth: Payer: Self-pay | Admitting: Cardiovascular Disease

## 2023-05-03 MED ORDER — LOSARTAN POTASSIUM 25 MG PO TABS: ORAL_TABLET | ORAL | 3 refills | Status: DC

## 2023-05-03 NOTE — Telephone Encounter (Signed)
*  STAT* If patient is at the pharmacy, call can be transferred to refill team.   1. Which medications need to be refilled? (please list name of each medication and dose if known)  new prescription for Losartan- new pharmacy   2. Would you like to learn more about the convenience, safety, & potential cost savings by using the [facility]?    3. Are you open to using the [facility] (Type [facility].   4. Which pharmacy/location (including street and city if local pharmacy) is medication to be sent to? [facility] [place]   5. Do they need a 30 day or 90 day supply? 90 days and refills

## 2023-05-03 NOTE — Telephone Encounter (Signed)
Pt's medication was sent to pt's pharmacy as requested. Confirmation received.  °

## 2023-07-13 ENCOUNTER — Other Ambulatory Visit: Payer: Self-pay | Admitting: Cardiovascular Disease

## 2023-10-07 ENCOUNTER — Other Ambulatory Visit: Payer: Self-pay | Admitting: Cardiovascular Disease

## 2023-11-02 NOTE — Progress Notes (Signed)
 [facility] Patient Clinic Note  Assessment/Plan: Ms.Jeanpaul is a 83 y.o.female  Assessment & Plan Hypertension, benign Blood pressure well controlled with losartan  25 mg daily.       Peripheral artery disease (CMS-HCC) History of PAD with critical limb ischemia leading to revascularization.  She is currently on aspirin , plavix , and lipitor.  This is managed by cardiology in [place].       Bilateral impacted cerumen Patient with some cerumen in bilateral ears, however she is not having any difficulty hearing and is not bothering her.  We discussed we can consider removal in the future if needed.     Dementia, unspecified dementia severity, unspecified dementia type, unspecified whether behavioral, psychotic, or mood disturbance or anxiety (CMS-HCC) Slums test at recent visit with a score of 12.  She still lives by herself and her daughter checks in with her daily and helps with her IADLs however she is able to make her own food and drive to certain places like the senior center.  We will continue to monitor.     Age-related osteoporosis without current pathological fracture Started fosamax 6 months ago.  She is doing well with the medication and not having any side effects.  Will continue at this time and plan on repeat Dexa in 5 years.          Subjective  Ms. Senseney is a 83 y.o. female  coming to clinic today for the following issues:  Chief Complaint  Patient presents with  . Hypertension    Patient denies checking BP. Denies chest pain, sob, dizziness and headaches.    HPI: History of Present Illness The patient presents for a routine follow-up visit.  She continues to drive, live independently, and cook without issues. No new symptoms or concerns have arisen.  Approximately six months ago, she started taking medication for bone density once a week and has not experienced any adverse effects, including reflux symptoms.  She had her  ears cleaned previously and has not experienced any hearing issues since. There is some earwax buildup, but it is not causing any problems.  No chest pain, shortness of breath, or reflux symptoms.  -   I have reviewed the problem list, medications, and allergies and have updated/reconciled them if needed.   Objective   VITALS: BP 138/82 (BP Position: Sitting)   Pulse 61   Temp 36.7 C (98.1 F) (Oral)   Resp 16   Ht 167.6 cm (5' 5.98)   Wt 57.6 kg (127 lb)   SpO2 99% Comment: RA  BMI 20.51 kg/m   Physical Exam Constitutional:      Appearance: Normal appearance.  HENT:     Head: Normocephalic and atraumatic.     Right Ear: There is impacted cerumen.     Left Ear: There is impacted cerumen.  Cardiovascular:     Rate and Rhythm: Normal rate and regular rhythm.     Pulses: Normal pulses.     Heart sounds: Normal heart sounds.  Pulmonary:     Effort: Pulmonary effort is normal.     Breath sounds: Normal breath sounds.  Musculoskeletal:     Right lower leg: No edema.     Left lower leg: No edema.  Skin:    General: Skin is warm and dry.  Neurological:     General: No focal deficit present.     Mental Status: She is alert.  Psychiatric:        Mood and Affect:  Mood normal.        Behavior: Behavior normal.        Thought Content: Thought content normal.        Judgment: Judgment normal.       LABS/IMAGING I have reviewed pertinent recent labs and imaging in [facility] [address], [place], [place] [address] . Telephone [phone] . Fax [phone]

## 2023-12-19 NOTE — Progress Notes (Signed)
 Patient with history of dementia and recently daughter noticed that it seems like she isn't smelling things well (she didn't notice that there were rotten eggs in her house or uncooked chicken in her trunk).  Suspect related to her dementia, but discussed that it could be relate to either a stroke or seizure, however given that she hasn't not had any other symptoms, daughter does not want to pursue further work up at this time.    Asberry Plough, MD Family Medicine Physician [facility] December 19, 2023 12:12 PM

## 2024-04-30 ENCOUNTER — Encounter: Payer: Self-pay | Admitting: Cardiology

## 2024-04-30 NOTE — Telephone Encounter (Signed)
 Spoke with the patient's daughter. She is trying to get records from [facility] faxed over to us  for Dr. Court to review.  Patient is scheduled for follow up on 9/22. Advised that Dr. Court will review with her at that time.

## 2024-05-14 ENCOUNTER — Encounter: Payer: Self-pay | Admitting: Cardiovascular Disease

## 2024-05-14 ENCOUNTER — Ambulatory Visit: Attending: Cardiovascular Disease | Admitting: Cardiovascular Disease

## 2024-05-14 VITALS — BP 120/74 | HR 58 | Ht 65.5 in | Wt 119.8 lb

## 2024-05-14 DIAGNOSIS — E782 Mixed hyperlipidemia: Secondary | ICD-10-CM | POA: Diagnosis not present

## 2024-05-14 DIAGNOSIS — Z9889 Other specified postprocedural states: Secondary | ICD-10-CM

## 2024-05-14 DIAGNOSIS — I739 Peripheral vascular disease, unspecified: Secondary | ICD-10-CM | POA: Diagnosis not present

## 2024-05-14 DIAGNOSIS — I70229 Atherosclerosis of native arteries of extremities with rest pain, unspecified extremity: Secondary | ICD-10-CM

## 2024-05-14 LAB — LIPID PANEL

## 2024-05-14 NOTE — Progress Notes (Signed)
 05/14/2024 Gina Carter   02-15-1941  [id number]  Primary Physician Gina Carter LABOR, MD Primary Cardiologist: Gina JINNY Lesches MD [facility], [place], [place]  HPI:  Gina Carter is a 83 y.o.   thin appearing widowed African-American female mother of 3, grandmother of 6 grandchildren referred to me by Dr. Lavona for peripheral vascular valuation because of critical limb ischemia and rest pain.  She is accompanied by her her daughter Gina Carter today.  She has no cardiac risk factors.  I last saw her in the office 11/07/2020. She was referred by Dr. Zan to Dr. Lavona because of this.  She has had dependent rubor and pain in her left foot for several weeks.  There are no open wounds.  She cannot walk with this.  Dopplers performed 01/14/2019 revealed high-grade mid left SFA stenosis with 0 vessel runoff.  Her right ABI was 0.85 and left was 0.38.   I performed peripheral angiography and intervention on her 01/15/2019 stenting of a distal left SFA CTO with a drug-eluting stent perform angioplasty on a high-grade segmental proximal left SFA.  She had 0 vessel runoff below the knee with a large collateral vessel originating from the popliteal artery to the dorsalis pedis.  She says that her foot feels better.  She did have some edema in her foot probably related to increased blood flow is related to the intervention.  She does have a small blister on her left great toe.  Her foot is warm and she had barely palpable pedal pulses.   Since I saw her in the office a year ago she continues to do well.  She lives alone and is independent.  She still drives.  She walks without limitation.  She denies chest pain, shortness of breath or claudication.  She did have Doppler studies performed at [facility] 02/23/2024 revealing a right ABI of 0.9 and a left of 0.6.   Current Meds  Medication Sig   aspirin  EC 81 MG tablet Take 81 mg by mouth daily.    atorvastatin  (LIPITOR) 80 MG tablet TAKE 1 TABLET BY  MOUTH EVERY DAY AT 6PM   clopidogrel  (PLAVIX ) 75 MG tablet TAKE 1 TABLET(75 MG) BY MOUTH DAILY   diphenhydrAMINE (BENADRYL) 2 % cream Apply 1 application topically 3 (three) times daily as needed for itching.   losartan  (COZAAR ) 25 MG tablet TAKE 1 TABLET(25 MG) BY MOUTH DAILY     No Known Allergies  Social History   Socioeconomic History   Marital status: Widowed    Spouse name: Not on file   Number of children: Not on file   Years of education: Not on file   Highest education level: Not on file  Occupational History   Not on file  Tobacco Use   Smoking status: Never   Smokeless tobacco: Never  Substance and Sexual Activity   Alcohol use: Never   Drug use: Not on file   Sexual activity: Not on file  Other Topics Concern   Not on file  Social History Narrative   Lives alone.  Three children.     Social Drivers of Corporate investment banker Strain: Low Risk  (07/19/2023)   Received from [facility]   Overall Financial Resource Strain (CARDIA)    Difficulty of Paying Living Expenses: Not very hard  Food Insecurity: No Food Insecurity (05/04/2024)   Received from [facility]   Hunger Vital Sign    Within the past 12 months, you  worried that your food would run out before you got the money to buy more.: Never true    Within the past 12 months, the food you bought just didn't last and you didn't have money to get more.: Never true  Transportation Needs: No Transportation Needs (05/04/2024)   Received from [facility]   PRAPARE - Transportation    Lack of Transportation (Medical): No    Lack of Transportation (Non-Medical): No  Physical Activity: Insufficiently Active (07/19/2023)   Received from [facility]   Exercise Vital Sign    On average, how many days per week do you engage in moderate to strenuous exercise (like a brisk walk)?: 3 days    On average, how many minutes do you engage in exercise at this level?: 30 min  Stress: No Stress Concern Present  (07/19/2023)   Received from [facility] - Occupational Stress Questionnaire    Feeling of Stress : Not at all  Social Connections: Moderately Integrated (07/19/2023)   Received from [facility]   Social Connection and Isolation Panel    In a typical week, how many times do you talk on the phone with family, friends, or neighbors?: More than three times a week    How often do you get together with friends or relatives?: More than three times a week    How often do you attend church or religious services?: More than 4 times per year    Do you belong to any clubs or organizations such as church groups, unions, fraternal or athletic groups, or school groups?: Yes    How often do you attend meetings of the clubs or organizations you belong to?: More than 4 times per year    Are you married, widowed, divorced, separated, never married, or living with a partner?: Widowed  Intimate Partner Violence: Not At Risk (07/19/2023)   Received from [facility]   Humiliation, Afraid, Rape, and Kick questionnaire    Within the last year, have you been afraid of your partner or ex-partner?: No    Within the last year, have you been humiliated or emotionally abused in other ways by your partner or ex-partner?: No    Within the last year, have you been kicked, hit, slapped, or otherwise physically hurt by your partner or ex-partner?: No    Within the last year, have you been raped or forced to have any kind of sexual activity by your partner or ex-partner?: No     Review of Systems: General: negative for chills, fever, night sweats or weight changes.  Cardiovascular: negative for chest pain, dyspnea on exertion, edema, orthopnea, palpitations, paroxysmal nocturnal dyspnea or shortness of breath Dermatological: negative for rash Respiratory: negative for cough or wheezing Urologic: negative for hematuria Abdominal: negative for nausea, vomiting, diarrhea,  bright red blood per rectum, melena, or hematemesis Neurologic: negative for visual changes, syncope, or dizziness All other systems reviewed and are otherwise negative except as noted above.    Blood pressure 120/74, pulse (!) 58, height 5' 5.5 (1.664 m), weight 119 lb 12.8 oz (54.3 kg), SpO2 99%.  General appearance: alert and no distress Neck: no adenopathy, no carotid bruit, no JVD, supple, symmetrical, trachea midline, and thyroid not enlarged, symmetric, no tenderness/mass/nodules Lungs: clear to auscultation bilaterally Heart: regular rate and rhythm, S1, S2 normal, no murmur, click, rub or gallop Extremities: extremities normal, atraumatic, no cyanosis or edema Pulses: Absent pedal pulses Skin: Skin  color, texture, turgor normal. No rashes or lesions Neurologic: Grossly normal  EKG EKG Interpretation Date/Time:  Monday May 14 2024 09:07:30 EDT Ventricular Rate:  58 PR Interval:  146 QRS Duration:  78 QT Interval:  414 QTC Calculation: 406 R Axis:   55  Text Interpretation: Sinus bradycardia T wave abnormality, consider anterolateral ischemia When compared with ECG of 23-Feb-2023 14:19, Nonspecific T wave abnormality now evident in Inferior leads Confirmed by Court Carrier [phone]) on 05/14/2024 9:16:18 AM    ASSESSMENT AND PLAN:   Critical limb ischemia with history of revascularization of same extremity History of critical limb ischemia status post intervention by myself 01/18/2019 of a distal left SFA CTO stented with a 6 mm x 120 mm long Eluvia drug-eluting stent.  I also performed angioplasty of the proximal SFA as well.  She had a large collateral from the popliteal to the dorsalis pedis but 0 vessel runoff otherwise.  She did have a 40% mid right SFA stenosis with 0 vessel runoff.  Her ABI on the left at the time did increase from 0.38 up to 0.75 and her rest pain and cyanosis resolved.  She currently denies claudication.  Her most recent Doppler studies performed  at [facility] showed a right ABI of 0.9 and a left of 0.6.  We will repeat this in 1 year.  Hyperlipidemia History of hyperlipidemia on high-dose statin therapy.  Will repeat a lipid liver profile today.     Carrier DOROTHA Court MD FACP,FACC,FAHA, [facility] 05/14/2024 9:26 AM

## 2024-05-14 NOTE — Assessment & Plan Note (Signed)
 History of hyperlipidemia on high-dose statin therapy.  Will repeat a lipid liver profile today.

## 2024-05-14 NOTE — Assessment & Plan Note (Signed)
 History of critical limb ischemia status post intervention by myself 01/18/2019 of a distal left SFA CTO stented with a 6 mm x 120 mm long Eluvia drug-eluting stent.  I also performed angioplasty of the proximal SFA as well.  She had a large collateral from the popliteal to the dorsalis pedis but 0 vessel runoff otherwise.  She did have a 40% mid right SFA stenosis with 0 vessel runoff.  Her ABI on the left at the time did increase from 0.38 up to 0.75 and her rest pain and cyanosis resolved.  She currently denies claudication.  Her most recent Doppler studies performed at [facility] showed a right ABI of 0.9 and a left of 0.6.  We will repeat this in 1 year.

## 2024-05-14 NOTE — Patient Instructions (Signed)
 Medication Instructions:  Your physician recommends that you continue on your current medications as directed. Please refer to the Current Medication list given to you today.  *If you need a refill on your cardiac medications before your next appointment, please call your pharmacy*  Lab Work: Your physician recommends that you have labs drawn today: Lipid/liver panel  If you have labs (blood work) drawn today and your tests are completely normal, you will receive your results only by: MyChart Message (if you have MyChart) OR A paper copy in the mail If you have any lab test that is abnormal or we need to change your treatment, we will call you to review the results.  Testing/Procedures: Your physician has requested that you have a lower extremity arterial duplex. During this test, ultrasound is used to evaluate arterial blood flow in the legs. Allow one hour for this exam. There are no restrictions or special instructions. This will take place at [address], 4th floor  **To do in July 2026**  Please note: We ask at that you not bring children with you during ultrasound (echo/ vascular) testing. Due to room size and safety concerns, children are not allowed in the ultrasound rooms during exams. Our front office staff cannot provide observation of children in our lobby area while testing is being conducted. An adult accompanying a patient to their appointment will only be allowed in the ultrasound room at the discretion of the ultrasound technician under special circumstances. We apologize for any inconvenience.   Your physician has requested that you have an ankle brachial index (ABI). During this test an ultrasound and blood pressure cuff are used to evaluate the arteries that supply the arms and legs with blood. Allow thirty minutes for this exam. There are no restrictions or special instructions. This will take place at [address] 4th floor  **To do in July 2026**   Please note:  We ask at that you not bring children with you during ultrasound (echo/ vascular) testing. Due to room size and safety concerns, children are not allowed in the ultrasound rooms during exams. Our front office staff cannot provide observation of children in our lobby area while testing is being conducted. An adult accompanying a patient to their appointment will only be allowed in the ultrasound room at the discretion of the ultrasound technician under special circumstances. We apologize for any inconvenience.   Follow-Up: At [facility], you and your health needs are our priority.  As part of our continuing mission to provide you with exceptional heart care, our providers are all part of one team.  This team includes your primary Cardiologist (physician) and Advanced Practice Providers or APPs (Physician Assistants and Nurse Practitioners) who all work together to provide you with the care you need, when you need it.  Your next appointment:   12 month(s)  Provider:   Dorn Lesches, MD

## 2024-05-15 ENCOUNTER — Ambulatory Visit: Payer: Self-pay | Admitting: Cardiovascular Disease

## 2024-05-15 LAB — HEPATIC FUNCTION PANEL
ALT: 10 IU/L (ref 0–32)
AST: 10 IU/L (ref 0–40)
Albumin: 4.4 g/dL (ref 3.7–4.7)
Alkaline Phosphatase: 81 IU/L (ref 48–129)
Bilirubin Total: 1 mg/dL (ref 0.0–1.2)
Bilirubin, Direct: 0.3 mg/dL (ref 0.00–0.40)
Total Protein: 6.7 g/dL (ref 6.0–8.5)

## 2024-05-15 LAB — LIPID PANEL
Cholesterol, Total: 184 mg/dL (ref 100–199)
HDL: 62 mg/dL (ref >39)
LDL CALC COMMENT:: 3 ratio (ref 0.0–4.4)
LDL Chol Calc (NIH): 104 mg/dL — AB (ref 0–99)
Triglycerides: 100 mg/dL (ref 0–149)
VLDL Cholesterol Cal: 18 mg/dL (ref 5–40)

## 2024-05-17 ENCOUNTER — Other Ambulatory Visit: Payer: Self-pay | Admitting: Cardiovascular Disease
